# Patient Record
Sex: Female | Born: 1955 | Race: Black or African American | Hispanic: No | Smoking: Current every day smoker
Health system: Southern US, Community
[De-identification: ages and names within clinical notes are randomized; demographics above are authoritative.]

## PROBLEM LIST (undated history)

## (undated) DIAGNOSIS — C44300 Unspecified malignant neoplasm of skin of unspecified part of face: Secondary | ICD-10-CM

## (undated) DIAGNOSIS — M1990 Unspecified osteoarthritis, unspecified site: Secondary | ICD-10-CM

## (undated) DIAGNOSIS — G43909 Migraine, unspecified, not intractable, without status migrainosus: Secondary | ICD-10-CM

## (undated) DIAGNOSIS — C189 Malignant neoplasm of colon, unspecified: Secondary | ICD-10-CM

## (undated) DIAGNOSIS — R7303 Prediabetes: Secondary | ICD-10-CM

## (undated) DIAGNOSIS — J449 Chronic obstructive pulmonary disease, unspecified: Secondary | ICD-10-CM

## (undated) DIAGNOSIS — C801 Malignant (primary) neoplasm, unspecified: Secondary | ICD-10-CM

## (undated) DIAGNOSIS — R0602 Shortness of breath: Secondary | ICD-10-CM

## (undated) HISTORY — PX: SKIN CANCER EXCISION: SHX779

## (undated) HISTORY — DX: Malignant neoplasm of colon, unspecified: C18.9

## (undated) HISTORY — PX: COLON SURGERY: SHX602

---

## 2007-08-24 ENCOUNTER — Ambulatory Visit (HOSPITAL_COMMUNITY): Admission: RE | Admit: 2007-08-24 | Discharge: 2007-08-24 | Payer: Self-pay | Admitting: Family Medicine

## 2008-03-12 ENCOUNTER — Ambulatory Visit (HOSPITAL_COMMUNITY): Admission: RE | Admit: 2008-03-12 | Discharge: 2008-03-12 | Payer: Self-pay | Admitting: Family Medicine

## 2008-10-12 ENCOUNTER — Ambulatory Visit (HOSPITAL_COMMUNITY): Admission: RE | Admit: 2008-10-12 | Discharge: 2008-10-12 | Payer: Self-pay | Admitting: Family Medicine

## 2009-01-30 ENCOUNTER — Ambulatory Visit (HOSPITAL_COMMUNITY): Admission: RE | Admit: 2009-01-30 | Discharge: 2009-01-30 | Payer: Self-pay | Admitting: Family Medicine

## 2009-12-20 ENCOUNTER — Ambulatory Visit (HOSPITAL_COMMUNITY): Admission: RE | Admit: 2009-12-20 | Discharge: 2009-12-20 | Payer: Self-pay | Admitting: Neurology

## 2010-04-05 ENCOUNTER — Encounter: Payer: Self-pay | Admitting: Neurology

## 2010-10-01 ENCOUNTER — Encounter (INDEPENDENT_AMBULATORY_CARE_PROVIDER_SITE_OTHER): Payer: Self-pay | Admitting: Internal Medicine

## 2010-10-28 MED ORDER — SODIUM CHLORIDE 0.45 % IV SOLN: Freq: Once | INTRAVENOUS | Status: DC

## 2010-10-29 ENCOUNTER — Ambulatory Visit (HOSPITAL_COMMUNITY)
Admission: RE | Admit: 2010-10-29 | Discharge: 2010-10-29 | Disposition: A | Discharge status: Home / Self Care | Payer: Medicaid Other | Source: Ambulatory Visit | Attending: Internal Medicine | Admitting: Internal Medicine

## 2010-10-29 ENCOUNTER — Encounter (HOSPITAL_COMMUNITY): Payer: Self-pay | Admitting: *Deleted

## 2010-10-29 ENCOUNTER — Other Ambulatory Visit (INDEPENDENT_AMBULATORY_CARE_PROVIDER_SITE_OTHER): Payer: Self-pay | Admitting: Internal Medicine

## 2010-10-29 ENCOUNTER — Encounter (INDEPENDENT_AMBULATORY_CARE_PROVIDER_SITE_OTHER): Payer: Self-pay | Admitting: Internal Medicine

## 2010-10-29 ENCOUNTER — Encounter (HOSPITAL_COMMUNITY)
Admission: RE | Disposition: A | Discharge status: Home / Self Care | Payer: Self-pay | Source: Ambulatory Visit | Attending: Internal Medicine

## 2010-10-29 DIAGNOSIS — D126 Benign neoplasm of colon, unspecified: Secondary | ICD-10-CM

## 2010-10-29 DIAGNOSIS — Z800 Family history of malignant neoplasm of digestive organs: Secondary | ICD-10-CM

## 2010-10-29 DIAGNOSIS — Z1211 Encounter for screening for malignant neoplasm of colon: Secondary | ICD-10-CM

## 2010-10-29 HISTORY — DX: Migraine, unspecified, not intractable, without status migrainosus: G43.909

## 2010-10-29 HISTORY — DX: Unspecified malignant neoplasm of skin of unspecified part of face: C44.300

## 2010-10-29 HISTORY — DX: Prediabetes: R73.03

## 2010-10-29 HISTORY — PX: COLONOSCOPY: SHX5424

## 2010-10-29 SURGERY — COLONOSCOPY
Anesthesia: Moderate Sedation

## 2010-10-29 MED ORDER — MIDAZOLAM HCL 5 MG/5ML IJ SOLN
INTRAMUSCULAR | Status: AC
  Filled 2010-10-29: qty 10

## 2010-10-29 MED ORDER — MEPERIDINE HCL 50 MG/ML IJ SOLN
INTRAMUSCULAR | Status: AC
  Filled 2010-10-29: qty 1

## 2010-10-29 MED ORDER — MEPERIDINE HCL 50 MG/ML IJ SOLN
INTRAMUSCULAR | Status: DC | PRN
  Administered 2010-10-29 (×2): 25 mg via INTRAVENOUS

## 2010-10-29 MED ORDER — MIDAZOLAM HCL 5 MG/5ML IJ SOLN
INTRAMUSCULAR | Status: DC | PRN
  Administered 2010-10-29: 1 mg via INTRAVENOUS
  Administered 2010-10-29: 2 mg via INTRAVENOUS
  Administered 2010-10-29: 1 mg via INTRAVENOUS

## 2010-10-29 NOTE — H&P (Signed)
April Wade is an 55 y.o. female.   Chief Complaint: For screening colonoscopy. HPI: Patient is 55 year old Caucasian female who is here for average risk screening colonoscopy. This is her first exam. She denies abdominal pain or rectal bleeding. He has occasional constipation for which she takes OTC laxatives. she denies anorexia or weight loss. History is negative for colorectal carcinoma.  Past Medical History  Diagnosis Date  . Headache, migraine   . Borderline diabetic   . Skin cancer of face     Past Surgical History  Procedure Date  . Skin cancer excision     [facility]    Family History  Problem Relation Age of Onset  . Anesthesia problems Neg Hx    Social History:  reports that she has been smoking.  She does not have any smokeless tobacco history on file. She reports that she does not drink alcohol or use illicit drugs.  Allergies: Not on File  Medications Prior to Admission  Medication Dose Route Frequency Provider Last Rate Last Dose  . 0.45 % sodium chloride infusion   Intravenous Once Malissa Hippo, MD      . meperidine (DEMEROL) 50 MG/ML injection           . midazolam (VERSED) 5 MG/5ML injection            Medications Prior to Admission  Medication Sig Dispense Refill  . albuterol (PROVENTIL) (2.5 MG/3ML) 0.083% nebulizer solution Take 2.5 mg by nebulization every 6 (six) hours as needed.        Marland Kitchen ibuprofen (ADVIL,MOTRIN) 800 MG tablet Take 800 mg by mouth every 8 (eight) hours as needed.        . SUMAtriptan (IMITREX) 100 MG tablet Take 100 mg by mouth once. PRN       . topiramate (TOPAMAX) 25 MG tablet Take 25 mg by mouth 3 (three) times daily.          No results found for this or any previous visit (from the past 48 hour(s)). No results found.  Review of Systems  Constitutional: Negative for fever and weight loss.  Gastrointestinal: Positive for constipation (occasional). Negative for heartburn, nausea, vomiting, abdominal pain, diarrhea, blood in  stool and melena.    Blood pressure 131/58, pulse 54, temperature 98 F (36.7 C), temperature source Oral, resp. rate 20, height 5\' 7"  (1.702 m), weight 121 lb (54.885 kg), SpO2 98.00%. Physical Exam  Constitutional: She appears well-developed.       thin  HENT:  Mouth/Throat: Oropharynx is clear and moist.  Eyes: Conjunctivae are normal. No scleral icterus.  Neck: No thyromegaly present.  Cardiovascular: Normal rate, regular rhythm and normal heart sounds.   No murmur heard. Respiratory: Breath sounds normal.  GI: Soft. She exhibits no distension and no mass. There is no tenderness.  Musculoskeletal: She exhibits no edema.  Lymphadenopathy:    She has no cervical adenopathy.  Neurological: She is alert.  Skin: Skin is warm and dry.     Assessment/Plan Average risk screening colonoscopy.   REHMAN,NAJEEB U 10/29/2010, 10:47 AM

## 2010-10-29 NOTE — Op Note (Signed)
COLONOSCOPY PROCEDURE REPORT  PATIENT:  April Wade  MR#:  [id number] Birthdate:  1955-11-30, 55 y.o., female Endoscopist:  Dr. Malissa Hippo, MD Referred By:  Dr. Corrie Mckusick MD Procedure Date: 10/29/2010  Procedure:   Colonoscopy  Indications: Average risk screening colonoscopy. This is patient's first exam  Informed Consent:  Procedure and risks were reviewed with the patient and informed consent was obtained. Medications:  Demerol 50 mg IV Versed 4 mg IV  Description of procedure:  After a digital rectal exam was performed, that colonoscope was advanced from the anus through the rectum and colon to the area of the cecum, ileocecal valve and appendiceal orifice. The cecum was deeply intubated. These structures were well-seen and photographed for the record. From the level of the cecum and ileocecal valve, the scope was slowly and cautiously withdrawn. The mucosal surfaces were carefully surveyed utilizing scope tip to flexion to facilitate fold flattening as needed. The scope was pulled down into the rectum where a thorough exam including retroflexion was performed.  Findings:   Preparation satisfactory sept there was pasty stool at a sending colon. 2 mm  polyp at cecum ablated via cold biopsy.  25 mm flat polyp at  Ascending colon central depression. Biopsy taken.  6 x 12 mm sessile polyp at distal transverse colon saline-assisted polypectomy carried out. 8 mm polyp at proximal sigmoid colon was snared. Small polyps distal to it was coagulated with snare tip. 15 mm flex polyps at distal sigmoid colon elevated with saline injection and polypectomy performed. Polypectomy complete.  Therapeutic/Diagnostic Maneuvers Performed:  See above  Complications:  None  Cecal Withdrawal Time:  29 minutes  Impression:  3 polyps were snared. One from transverse colon and 2 from sigmoid colon. smaa cecal polyp ablated via cold biopsy. 25 mm flat polyp at  ascending colon with central  depression. it was biopsied. This will need surgical removal.  Recommendations:  No aspirin for 10 days. Physician will  contact you with biopsy results  April Wade U  10/29/2010 11:53 AM  CC: Dr.JOHN C GOLDING. MD

## 2010-11-04 ENCOUNTER — Encounter (HOSPITAL_COMMUNITY): Payer: Self-pay | Admitting: Internal Medicine

## 2010-11-18 ENCOUNTER — Encounter (HOSPITAL_COMMUNITY)
Admission: RE | Admit: 2010-11-18 | Discharge: 2010-11-18 | Disposition: A | Discharge status: Home / Self Care | Payer: Medicaid Other | Source: Ambulatory Visit | Attending: General Surgery | Admitting: General Surgery

## 2010-11-18 ENCOUNTER — Encounter (HOSPITAL_COMMUNITY): Payer: Self-pay

## 2010-11-18 ENCOUNTER — Other Ambulatory Visit: Payer: Self-pay

## 2010-11-18 HISTORY — DX: Shortness of breath: R06.02

## 2010-11-18 HISTORY — DX: Chronic obstructive pulmonary disease, unspecified: J44.9

## 2010-11-18 LAB — BASIC METABOLIC PANEL
CO2: 33 mEq/L — ABNORMAL HIGH (ref 19–32)
Calcium: 9.1 mg/dL (ref 8.4–10.5)
Chloride: 103 mEq/L (ref 96–112)
Creatinine, Ser: 0.68 mg/dL (ref 0.50–1.10)
Glucose, Bld: 63 mg/dL — ABNORMAL LOW (ref 70–99)

## 2010-11-18 LAB — SURGICAL PCR SCREEN
MRSA, PCR: NEGATIVE
Staphylococcus aureus: NEGATIVE

## 2010-11-18 LAB — CBC
HCT: 34.9 % — ABNORMAL LOW (ref 36.0–46.0)
Hemoglobin: 11.6 g/dL — ABNORMAL LOW (ref 12.0–15.0)
MCHC: 33.2 g/dL (ref 30.0–36.0)

## 2010-11-18 LAB — DIFFERENTIAL
Basophils Absolute: 0 10*3/uL (ref 0.0–0.1)
Eosinophils Relative: 2 % (ref 0–5)
Lymphocytes Relative: 44 % (ref 12–46)
Lymphs Abs: 3.1 10*3/uL (ref 0.7–4.0)
Monocytes Absolute: 0.4 10*3/uL (ref 0.1–1.0)
Neutro Abs: 3.3 10*3/uL (ref 1.7–7.7)

## 2010-11-18 MED ORDER — LACTATED RINGERS IV SOLN: INTRAVENOUS | Status: DC

## 2010-11-18 MED ORDER — METRONIDAZOLE 500 MG PO TABS: 500.0000 mg | ORAL_TABLET | Freq: Once | ORAL | Status: DC

## 2010-11-18 NOTE — Consult Note (Signed)
NAMEAYALA, RIBBLE NO.:  [id number]  MEDICAL RECORD NO.:  [id number]  LOCATION:                                 FACILITY:  PHYSICIAN:  Barbaraann Barthel, M.D. DATE OF BIRTH:  1955-05-27  DATE OF CONSULTATION:  11/17/2010 DATE OF DISCHARGE:                                CONSULTATION   Note, this is a 55 year old white female who I saw in my office and dictated an H and P on November 05, 2010.  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  HISTORY OF PRESENT MEDICAL ILLNESS:  This lady was seen by Dr. Karilyn Cota, who underwent a screening colonoscopy and was found to have multiple polyps, the most suspicious one being at the hepatic flexure which was very likely an infiltrating carcinoma.  There was certainly some very high level of dysplasia present.  I discussed this with Dr. Karilyn Cota as well as Dr. Colonel Bald in the Pathology department and we will plan for a resection likely a right colectomy to encompass this area.  I discussed this in detail with the patient and informed consent was obtained.  We plan for her to be admitted via the outpatient department after a mechanical bowel prep as an outpatient and parenteral antibiotics on day of admission and plan for surgery on the same day as admission.  We discussed complications not limited to but including bleeding, infection, and anastomotic leak and the possibility of more surgery being required.  Informed consent was obtained.  PAST MEDICAL HISTORY:  The patient has a history of diabetes, which has been controlled by diet.  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  She has had no previous surgery.  PHYSICAL EXAM:  GENERAL:  She is a pleasant, anxious-appearing 55 year old thin female, who is 5 feet 7, weighs 121 pounds. VITAL SIGNS:  Her temperature is 98.4, pulse was 60 and regular, respirations are 12 per minute,  and blood pressure is 120/54. HEENT:  Head is normocephalic.  Eyes:  Extraocular movements are intact. Pupils were round and reactive to light and accommodation.  There is no conjunctive pallor or scleral injection.  The sclera is a normal tincture.  Her nose and oral mucosa were moist.  The patient has dental prosthesis. NECK:  Supple and cylindrical without jugular vein distention, thyromegaly, tracheal deviation, or cervical adenopathy.  No bruits are appreciated. CHEST:  Clear both anterior and posterior auscultation. HEART:  Regular rhythm. BREASTS AND AXILLAE:  Without masses. ABDOMEN:  Soft. PELVIC:  Deferred. EXTREMITIES:  Within normal limits. SKIN:  Within normal limits. RECTAL:  Not repeated as the patient has just undergone colonoscopy.  REVIEW OF SYSTEMS:  NEURO SYSTEM:  Grossly no focal findings.  She has a history of migraines.  No history of seizures.  ENDOCRINE SYSTEM: Diabetes, controlled by diet.  CARDIORESPIRATORY SYSTEM:  See history of COPD and tobacco use.  She smokes half a pack of cigarettes a day.  She is not an alcohol abuser.  MUSCULOSKELETAL SYSTEM:  Grossly within normal limits for chronic arthritic complaints.  OB/GYN HISTORY:  She  is a gravida 3, para 2, abortus one, cesarean zero female with no family history of breast carcinoma.  Her last menstrual period was approximately 8 years ago.  I do not know when her last mammogram was recorded.  GI SYSTEM:  No history of hepatitis, constipation, diarrhea, bright red rectal bleeding, black tarry stools, history of inflammatory bowel disease, or unexplained weight loss.  Results of colonoscopy done in 2012 or as mentioned above with the plans for colon resection.  GU SYSTEM:  No history of dysuria or kidney stones.  IMPRESSION:  Therefore, Ms. 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   As stated, we discussed complications and informed consent was obtained.  ALLERGIES:  The patient has no known allergies.  MEDICATIONS:  Include, vitamin D3 2000 international units a day, Restasis 0.05% one drop two times a day, Proventil spray q.4 h. p.r.n. as needed, ibuprofen 800 mg three times a day as needed, topiramate 25 mg at bedtime, sumatriptan 100 mg one as needed and as stated she has no known allergies.  PLAN:  Therefore, right colectomy via the outpatient department and with appropriate mechanical and antibiotic bowel preparation.     Barbaraann Barthel, M.D.     WB/MEDQ  D:  11/17/2010  T:  11/17/2010  Job:  [id number]  cc:   Lionel December, M.D. Fax: [phone]

## 2010-11-18 NOTE — Patient Instructions (Addendum)
[name]  11/18/2010   Your procedure is scheduled on:  11/21/2010  [facility] to [facility] at [id number].  Call this number if you have problems the morning of surgery: [phone]   Remember:   Do not eat food:After Midnight.  Do not drink clear liquids: After Midnight.  Take these medicines the morning of surgery with A SIP OF WATER: topamax, albutereol inhaler-nebs   Do not wear jewelry, make-up or nail polish.  Do not wear lotions, powders, or perfumes. You may wear deodorant.  Do not shave 48 hours prior to surgery.  Do not bring valuables to the hospital.  Contacts, dentures or bridgework may not be worn into surgery.  Leave suitcase in the car. After surgery it may be brought to your room.  For patients admitted to the hospital, checkout time is 11:00 AM the day of discharge.   Patients discharged the day of surgery will not be allowed to drive home.  Name and phone number of your driver: n/a  Special Instructions: CHG Shower Use Special Wash: 1/2 bottle night before surgery and 1/2 bottle morning of surgery.   Please read over the following fact sheets that you were given: Pain Booklet, MRSA Information, Surgical Site Infection Prevention, Anesthesia Post-op Instructions and Care and Recovery After Surgery PATIENT INSTRUCTIONS POST-ANESTHESIA  IMMEDIATELY FOLLOWING SURGERY:  Do not drive or operate machinery for the first twenty four hours after surgery.  Do not make any important decisions for twenty four hours after surgery or while taking narcotic pain medications or sedatives.  If you develop intractable nausea and vomiting or a severe headache please notify your doctor immediately.  FOLLOW-UP:  Please make an appointment with your surgeon as instructed. You do not need to follow up with anesthesia unless specifically instructed to do so.  WOUND CARE INSTRUCTIONS (if applicable):  Keep a dry clean dressing on the anesthesia/puncture wound site if there is drainage.  Once the  wound has quit draining you may leave it open to air.  Generally you should leave the bandage intact for twenty four hours unless there is drainage.  If the epidural site drains for more than 36-48 hours please call the anesthesia department.  QUESTIONS?:  Please feel free to call your physician or the hospital operator if you have any questions, and they will be happy to assist you.     [facility] Anesthesia Department [address] [place] [phone]

## 2010-11-18 NOTE — Pre-Procedure Instructions (Signed)
K+ 3.1-faxed to Dr Daisy Blossom office.Called and spoke with Harriett Sine to have him look at these labs.

## 2010-11-19 NOTE — Pre-Procedure Instructions (Signed)
Dr Jayme Cloud shown K+ of 3.1.Will do I-stat on arrival. Left message at Dr Daisy Blossom office to make sure they will put pt on potassium prior to surgery.

## 2010-11-21 ENCOUNTER — Inpatient Hospital Stay (HOSPITAL_COMMUNITY): Payer: Medicaid Other | Admitting: Anesthesiology

## 2010-11-21 ENCOUNTER — Encounter (HOSPITAL_COMMUNITY): Payer: Self-pay | Admitting: Anesthesiology

## 2010-11-21 ENCOUNTER — Encounter (HOSPITAL_COMMUNITY): Payer: Self-pay | Admitting: *Deleted

## 2010-11-21 ENCOUNTER — Inpatient Hospital Stay (HOSPITAL_COMMUNITY)
Admission: RE | Admit: 2010-11-21 | Discharge: 2010-11-26 | DRG: 331 | Disposition: A | Discharge status: Home / Self Care | Payer: Medicaid Other | Source: Ambulatory Visit | Attending: General Surgery | Admitting: General Surgery

## 2010-11-21 ENCOUNTER — Encounter (HOSPITAL_COMMUNITY)
Admission: RE | Disposition: A | Discharge status: Home / Self Care | Payer: Self-pay | Source: Ambulatory Visit | Attending: General Surgery

## 2010-11-21 ENCOUNTER — Other Ambulatory Visit: Payer: Self-pay | Admitting: General Surgery

## 2010-11-21 DIAGNOSIS — Z23 Encounter for immunization: Secondary | ICD-10-CM

## 2010-11-21 DIAGNOSIS — J449 Chronic obstructive pulmonary disease, unspecified: Secondary | ICD-10-CM | POA: Diagnosis present

## 2010-11-21 DIAGNOSIS — C189 Malignant neoplasm of colon, unspecified: Secondary | ICD-10-CM

## 2010-11-21 DIAGNOSIS — J4489 Other specified chronic obstructive pulmonary disease: Secondary | ICD-10-CM | POA: Diagnosis present

## 2010-11-21 DIAGNOSIS — Z01812 Encounter for preprocedural laboratory examination: Secondary | ICD-10-CM

## 2010-11-21 DIAGNOSIS — C183 Malignant neoplasm of hepatic flexure: Principal | ICD-10-CM | POA: Diagnosis present

## 2010-11-21 DIAGNOSIS — Z01810 Encounter for preprocedural cardiovascular examination: Secondary | ICD-10-CM

## 2010-11-21 DIAGNOSIS — E876 Hypokalemia: Secondary | ICD-10-CM | POA: Diagnosis not present

## 2010-11-21 HISTORY — PX: COLOSTOMY REVISION: SHX5232

## 2010-11-21 LAB — CBC
Platelets: 206 10*3/uL (ref 150–400)
RBC: 3.51 MIL/uL — ABNORMAL LOW (ref 3.87–5.11)
RDW: 15.4 % (ref 11.5–15.5)
WBC: 16.7 10*3/uL — ABNORMAL HIGH (ref 4.0–10.5)

## 2010-11-21 LAB — BASIC METABOLIC PANEL
CO2: 28 mEq/L (ref 19–32)
Calcium: 9.1 mg/dL (ref 8.4–10.5)
Calcium: 8.4 mg/dL (ref 8.4–10.5)
Chloride: 104 mEq/L (ref 96–112)
Creatinine, Ser: 0.63 mg/dL (ref 0.50–1.10)
GFR calc Af Amer: >60 mL/min (ref >60)
Glucose, Bld: 117 mg/dL — ABNORMAL HIGH (ref 70–99)
Potassium: 3.4 mEq/L — ABNORMAL LOW (ref 3.5–5.1)
Sodium: 145 mEq/L (ref 135–145)
Sodium: 142 mEq/L (ref 135–145)

## 2010-11-21 LAB — DIFFERENTIAL
Basophils Absolute: 0 10*3/uL (ref 0.0–0.1)
Eosinophils Relative: 0 % (ref 0–5)
Lymphocytes Relative: 11 % — ABNORMAL LOW (ref 12–46)
Lymphs Abs: 1.9 10*3/uL (ref 0.7–4.0)
Neutro Abs: 14.1 10*3/uL — ABNORMAL HIGH (ref 1.7–7.7)
Neutrophils Relative %: 84 % — ABNORMAL HIGH (ref 43–77)

## 2010-11-21 SURGERY — COLECTOMY, RIGHT
Anesthesia: General | Laterality: Right

## 2010-11-21 MED ORDER — ACETAMINOPHEN 325 MG PO TABS
650.0000 mg | ORAL_TABLET | Freq: Four times a day (QID) | ORAL | Status: DC | PRN
  Administered 2010-11-25: 650 mg via ORAL
  Filled 2010-11-21: qty 2

## 2010-11-21 MED ORDER — SUCCINYLCHOLINE CHLORIDE 20 MG/ML IJ SOLN
INTRAMUSCULAR | Status: AC
  Filled 2010-11-21: qty 1

## 2010-11-21 MED ORDER — ACETAMINOPHEN 650 MG RE SUPP: 650.0000 mg | Freq: Four times a day (QID) | RECTAL | Status: DC | PRN

## 2010-11-21 MED ORDER — GLYCOPYRROLATE 0.2 MG/ML IJ SOLN
INTRAMUSCULAR | Status: DC | PRN
  Administered 2010-11-21: .6 mg via INTRAVENOUS

## 2010-11-21 MED ORDER — CIPROFLOXACIN IN D5W 400 MG/200ML IV SOLN
INTRAVENOUS | Status: AC
  Filled 2010-11-21: qty 200

## 2010-11-21 MED ORDER — PROPOFOL 10 MG/ML IV EMUL
INTRAVENOUS | Status: AC
  Filled 2010-11-21: qty 20

## 2010-11-21 MED ORDER — GLYCOPYRROLATE 0.2 MG/ML IJ SOLN
0.2000 mg | Freq: Once | INTRAMUSCULAR | Status: AC | PRN
  Administered 2010-11-21: 0.2 mg via INTRAVENOUS

## 2010-11-21 MED ORDER — ACETAMINOPHEN 325 MG PO TABS: 325.0000 mg | ORAL_TABLET | ORAL | Status: DC | PRN

## 2010-11-21 MED ORDER — POVIDONE-IODINE 10 % EX OINT
TOPICAL_OINTMENT | CUTANEOUS | Status: AC
  Filled 2010-11-21: qty 2

## 2010-11-21 MED ORDER — FENTANYL CITRATE 0.05 MG/ML IJ SOLN
INTRAMUSCULAR | Status: AC
  Filled 2010-11-21: qty 5

## 2010-11-21 MED ORDER — METRONIDAZOLE IN NACL 5-0.79 MG/ML-% IV SOLN
500.0000 mg | Freq: Three times a day (TID) | INTRAVENOUS | Status: AC
  Administered 2010-11-21 – 2010-11-22 (×2): 500 mg via INTRAVENOUS
  Filled 2010-11-21 (×4): qty 100

## 2010-11-21 MED ORDER — ROCURONIUM BROMIDE 100 MG/10ML IV SOLN
INTRAVENOUS | Status: DC | PRN
  Administered 2010-11-21: 40 mg via INTRAVENOUS
  Administered 2010-11-21: 10 mg via INTRAVENOUS

## 2010-11-21 MED ORDER — LIDOCAINE HCL 1 % IJ SOLN
INTRAMUSCULAR | Status: DC | PRN
  Administered 2010-11-21: 40 mg via INTRADERMAL

## 2010-11-21 MED ORDER — NEOSTIGMINE METHYLSULFATE 1 MG/ML IJ SOLN
INTRAMUSCULAR | Status: DC | PRN
  Administered 2010-11-21: 3 mg via INTRAMUSCULAR

## 2010-11-21 MED ORDER — LORAZEPAM 0.5 MG PO TABS: 0.5000 mg | ORAL_TABLET | Freq: Every evening | ORAL | Status: DC | PRN

## 2010-11-21 MED ORDER — LACTATED RINGERS IV SOLN
INTRAVENOUS | Status: DC | PRN
  Administered 2010-11-21 (×2): via INTRAVENOUS

## 2010-11-21 MED ORDER — SODIUM CHLORIDE 0.9 % IV SOLN
INTRAVENOUS | Status: DC
  Administered 2010-11-21 – 2010-11-23 (×3): via INTRAVENOUS

## 2010-11-21 MED ORDER — MIDAZOLAM HCL 2 MG/2ML IJ SOLN
1.0000 mg | INTRAMUSCULAR | Status: DC | PRN
  Administered 2010-11-21: 2 mg via INTRAVENOUS

## 2010-11-21 MED ORDER — TOPIRAMATE 25 MG PO TABS
25.0000 mg | ORAL_TABLET | Freq: Three times a day (TID) | ORAL | Status: DC
  Administered 2010-11-21 – 2010-11-26 (×14): 25 mg via ORAL
  Filled 2010-11-21 (×21): qty 1

## 2010-11-21 MED ORDER — MORPHINE SULFATE 2 MG/ML IJ SOLN
1.0000 mg | INTRAMUSCULAR | Status: DC | PRN
  Administered 2010-11-22 – 2010-11-25 (×9): 1 mg via INTRAVENOUS
  Filled 2010-11-21 (×9): qty 1

## 2010-11-21 MED ORDER — NEOSTIGMINE METHYLSULFATE 1 MG/ML IJ SOLN
INTRAMUSCULAR | Status: AC
  Filled 2010-11-21: qty 10

## 2010-11-21 MED ORDER — ONDANSETRON HCL 4 MG/2ML IJ SOLN: 4.0000 mg | Freq: Once | INTRAMUSCULAR | Status: DC | PRN

## 2010-11-21 MED ORDER — ROCURONIUM BROMIDE 50 MG/5ML IV SOLN
INTRAVENOUS | Status: AC
  Filled 2010-11-21: qty 1

## 2010-11-21 MED ORDER — GLYCOPYRROLATE 0.2 MG/ML IJ SOLN
INTRAMUSCULAR | Status: AC
  Filled 2010-11-21: qty 2

## 2010-11-21 MED ORDER — ONDANSETRON HCL 4 MG/2ML IJ SOLN
4.0000 mg | Freq: Once | INTRAMUSCULAR | Status: AC
  Administered 2010-11-21: 4 mg via INTRAVENOUS

## 2010-11-21 MED ORDER — FENTANYL CITRATE 0.05 MG/ML IJ SOLN
25.0000 ug | INTRAMUSCULAR | Status: DC | PRN
  Administered 2010-11-21: 50 ug via INTRAVENOUS

## 2010-11-21 MED ORDER — STERILE WATER FOR IRRIGATION IR SOLN
Status: DC | PRN
  Administered 2010-11-21 (×2): 2000 mL

## 2010-11-21 MED ORDER — FENTANYL CITRATE 0.05 MG/ML IJ SOLN
INTRAMUSCULAR | Status: AC
  Administered 2010-11-21: 50 ug via INTRAVENOUS
  Filled 2010-11-21: qty 2

## 2010-11-21 MED ORDER — METRONIDAZOLE IN NACL 5-0.79 MG/ML-% IV SOLN
INTRAVENOUS | Status: AC
  Filled 2010-11-21: qty 100

## 2010-11-21 MED ORDER — CIPROFLOXACIN IN D5W 400 MG/200ML IV SOLN
400.0000 mg | Freq: Two times a day (BID) | INTRAVENOUS | Status: AC
  Administered 2010-11-21 – 2010-11-22 (×2): 400 mg via INTRAVENOUS
  Filled 2010-11-21 (×3): qty 200

## 2010-11-21 MED ORDER — CIPROFLOXACIN IN D5W 400 MG/200ML IV SOLN: 500.0000 mg | INTRAVENOUS | Status: DC

## 2010-11-21 MED ORDER — FENTANYL CITRATE 0.05 MG/ML IJ SOLN
INTRAMUSCULAR | Status: DC | PRN
  Administered 2010-11-21 (×7): 50 ug via INTRAVENOUS

## 2010-11-21 MED ORDER — GLYCOPYRROLATE 0.2 MG/ML IJ SOLN
INTRAMUSCULAR | Status: AC
  Filled 2010-11-21: qty 1

## 2010-11-21 MED ORDER — METRONIDAZOLE IN NACL 5-0.79 MG/ML-% IV SOLN
500.0000 mg | INTRAVENOUS | Status: AC
  Administered 2010-11-21: 500 mg via INTRAVENOUS

## 2010-11-21 MED ORDER — SODIUM CHLORIDE 0.9 % IR SOLN
Status: DC | PRN
  Administered 2010-11-21 (×2): 2000 mL

## 2010-11-21 MED ORDER — ONDANSETRON HCL 4 MG/2ML IJ SOLN
INTRAMUSCULAR | Status: AC
  Filled 2010-11-21: qty 2

## 2010-11-21 MED ORDER — CIPROFLOXACIN IN D5W 400 MG/200ML IV SOLN
INTRAVENOUS | Status: DC | PRN
  Administered 2010-11-21: 400 mg via INTRAVENOUS

## 2010-11-21 MED ORDER — ONDANSETRON HCL 4 MG/2ML IJ SOLN
4.0000 mg | Freq: Four times a day (QID) | INTRAMUSCULAR | Status: DC | PRN
  Administered 2010-11-22: 4 mg via INTRAVENOUS
  Filled 2010-11-21: qty 2

## 2010-11-21 MED ORDER — LACTATED RINGERS IV SOLN: INTRAVENOUS | Status: DC

## 2010-11-21 MED ORDER — PNEUMOCOCCAL VAC POLYVALENT 25 MCG/0.5ML IJ INJ
0.5000 mL | INJECTION | INTRAMUSCULAR | Status: AC
  Administered 2010-11-22: 0.5 mL via INTRAMUSCULAR
  Filled 2010-11-21: qty 0.5

## 2010-11-21 MED ORDER — MIDAZOLAM HCL 2 MG/2ML IJ SOLN
INTRAMUSCULAR | Status: AC
  Filled 2010-11-21: qty 2

## 2010-11-21 MED ORDER — PHENOL 1.4 % MT LIQD
1.0000 | OROMUCOSAL | Status: DC | PRN
  Filled 2010-11-21: qty 177

## 2010-11-21 MED ORDER — PROPOFOL 10 MG/ML IV EMUL
INTRAVENOUS | Status: DC | PRN
  Administered 2010-11-21: 130 mg via INTRAVENOUS

## 2010-11-21 MED ORDER — LIDOCAINE HCL (PF) 1 % IJ SOLN
INTRAMUSCULAR | Status: AC
  Filled 2010-11-21: qty 5

## 2010-11-21 MED ORDER — LACTATED RINGERS IV SOLN
INTRAVENOUS | Status: DC
  Administered 2010-11-21: 08:00:00 via INTRAVENOUS

## 2010-11-21 MED ORDER — ALBUTEROL SULFATE HFA 108 (90 BASE) MCG/ACT IN AERS: 2.0000 | INHALATION_SPRAY | Freq: Four times a day (QID) | RESPIRATORY_TRACT | Status: DC | PRN

## 2010-11-21 SURGICAL SUPPLY — 70 items
APPLIER CLIP 11 MED OPEN (CLIP)
APPLIER CLIP 13 LRG OPEN (CLIP)
ATTRACTOMAT 16X20 MAGNETIC DRP (DRAPES) ×2 IMPLANT
BAG HAMPER (MISCELLANEOUS) ×2 IMPLANT
BARRIER SKIN 2 3/4 (OSTOMY) IMPLANT
CELLS DAT CNTRL 66122 CELL SVR (MISCELLANEOUS) IMPLANT
CLAMP POUCH DRAINAGE QUIET (OSTOMY) IMPLANT
CLIP APPLIE 11 MED OPEN (CLIP) IMPLANT
CLIP APPLIE 13 LRG OPEN (CLIP) IMPLANT
CLOTH BEACON ORANGE TIMEOUT ST (SAFETY) ×2 IMPLANT
COVER LIGHT HANDLE STERIS (MISCELLANEOUS) ×4 IMPLANT
COVER MAYO STAND XLG (DRAPE) IMPLANT
DRAPE PROXIMA HALF (DRAPES) ×2 IMPLANT
DRAPE WARM FLUID 44X44 (DRAPE) ×2 IMPLANT
DRSG TEGADERM 4X10 (GAUZE/BANDAGES/DRESSINGS) ×4 IMPLANT
ELECT REM PT RETURN 9FT ADLT (ELECTROSURGICAL) ×2
ELECTRODE REM PT RTRN 9FT ADLT (ELECTROSURGICAL) ×1 IMPLANT
GLOVE BIOGEL PI IND STRL 8 (GLOVE) ×1 IMPLANT
GLOVE BIOGEL PI INDICATOR 8 (GLOVE) ×1
GLOVE ECLIPSE 6.5 STRL STRAW (GLOVE) ×6 IMPLANT
GLOVE INDICATOR 7.0 STRL GRN (GLOVE) ×2 IMPLANT
GLOVE OPTIFIT SS 8.0 STRL (GLOVE) ×4 IMPLANT
GLOVE SKINSENSE NS SZ7.0 (GLOVE) ×2
GLOVE SKINSENSE STRL SZ7.0 (GLOVE) ×2 IMPLANT
GOWN BRE IMP SLV AUR XL STRL (GOWN DISPOSABLE) ×6 IMPLANT
INST SET MAJOR GENERAL (KITS) ×2 IMPLANT
KIT ROOM TURNOVER APOR (KITS) IMPLANT
LIGASURE IMPACT 36 18CM CVD LR (INSTRUMENTS) ×2 IMPLANT
MANIFOLD NEPTUNE II (INSTRUMENTS) ×2 IMPLANT
NS IRRIG 1000ML POUR BTL (IV SOLUTION) ×4 IMPLANT
PACK ABDOMINAL MAJOR (CUSTOM PROCEDURE TRAY) ×2 IMPLANT
PAD ARMBOARD 7.5X6 YLW CONV (MISCELLANEOUS) ×2 IMPLANT
POUCH OSTOMY 2 3/4  H 3804 (WOUND CARE)
POUCH OSTOMY 2 PC DRNBL 2.75 (WOUND CARE) IMPLANT
RELOAD LINEAR CUT PROX 55 BLUE (ENDOMECHANICALS) IMPLANT
RELOAD PROXIMATE 75MM BLUE (ENDOMECHANICALS) ×4 IMPLANT
RETAINER VISCERA MED (MISCELLANEOUS) ×2 IMPLANT
RETRACTOR WND ALEXIS 25 LRG (MISCELLANEOUS) ×1 IMPLANT
RETRACTOR WOUND ALXS 34CM XLRG (MISCELLANEOUS) IMPLANT
RTRCTR WOUND ALEXIS 18CM MED (MISCELLANEOUS)
RTRCTR WOUND ALEXIS 25CM LRG (MISCELLANEOUS) ×2
RTRCTR WOUND ALEXIS 34CM XLRG (MISCELLANEOUS)
SET BASIN LINEN APH (SET/KITS/TRAYS/PACK) ×2 IMPLANT
SOL PREP PROV IODINE SCRUB 4OZ (MISCELLANEOUS) ×2 IMPLANT
SPONGE GAUZE 4X4 12PLY (GAUZE/BANDAGES/DRESSINGS) ×2 IMPLANT
SPONGE LAP 18X18 X RAY DECT (DISPOSABLE) ×2 IMPLANT
STAPLER AUT SUT LDS 15W (STAPLE) IMPLANT
STAPLER GUN LINEAR PROX 60 (STAPLE) ×2 IMPLANT
STAPLER PROXIMATE 55 BLUE (STAPLE) ×2 IMPLANT
STAPLER PROXIMATE 75MM BLUE (STAPLE) ×2 IMPLANT
STAPLER VISISTAT 35W (STAPLE) ×2 IMPLANT
SUCTION POOLE TIP (SUCTIONS) ×2 IMPLANT
SUT CHROMIC 4 0 PS 2 18 (SUTURE) IMPLANT
SUT NOVA NAB GS-26 0 60 (SUTURE) IMPLANT
SUT PDS AB 3-0 SH 27 (SUTURE) IMPLANT
SUT PROLENE 0 CT 1 CR/8 (SUTURE) ×4 IMPLANT
SUT PROLENE 1 CT 1 30 (SUTURE) IMPLANT
SUT SILK 2 0 (SUTURE) ×1
SUT SILK 2-0 18XBRD TIE 12 (SUTURE) ×1 IMPLANT
SUT SILK 3 0 SH CR/8 (SUTURE) ×4 IMPLANT
SUT VIC AB 0 CT1 27 (SUTURE) ×1
SUT VIC AB 0 CT1 27XBRD ANTBC (SUTURE) ×1 IMPLANT
SUT VIC AB 0 CT1 27XCR 8 STRN (SUTURE) IMPLANT
SUT VIC AB 3-0 SH 27 (SUTURE) ×1
SUT VIC AB 3-0 SH 27X BRD (SUTURE) ×1 IMPLANT
SUT VICRYL AB 2 0 TIES (SUTURE) ×2 IMPLANT
SUT VICRYL AB 3 0 TIES (SUTURE) IMPLANT
SYR BULB IRRIGATION 50ML (SYRINGE) ×2 IMPLANT
TRAY FOLEY CATH 14FR (SET/KITS/TRAYS/PACK) ×2 IMPLANT
WATER STERILE IRR 1000ML POUR (IV SOLUTION) ×8 IMPLANT

## 2010-11-21 NOTE — Anesthesia Preprocedure Evaluation (Signed)
Anesthesia Evaluation  Name, MR# and DOB Patient awake  General Assessment Comment  Reviewed: Allergy & Precautions, H&P , NPO status , Patient's Chart, lab work & pertinent test results  Airway Mallampati: I TM Distance: >3 FB Neck ROM: Full    Dental  (+) Edentulous Upper and Edentulous Lower   Pulmonary (+) shortness of breath asthmaCOPD COPD inhaler  clear to auscultation  breath sounds clear to auscultation none    Cardiovascular Regular Normal    Neuro/Psych   Headaches   Negative Psych ROS  GI/Hepatic/Renal negative GI ROS  negative Liver ROS  negative Renal ROS        Endo/Other  (+) Diabetes mellitus-, Well Controlled, Type 2,     Abdominal Normal abdominal exam  (+)   Musculoskeletal negative musculoskeletal ROS (+)   Hematology  (+) Blood dyscrasia, anemia, ,   Peds  Reproductive/Obstetrics    Anesthesia Other Findings             Anesthesia Physical Anesthesia Plan  ASA: II  Anesthesia Plan: General   Post-op Pain Management:    Induction: Intravenous  Airway Management Planned: Oral ETT  Additional Equipment:   Intra-op Plan:   Post-operative Plan: Extubation in OR  Informed Consent: I have reviewed the patients History and Physical, chart, labs and discussed the procedure including the risks, benefits and alternatives for the proposed anesthesia with the patient or authorized representative who has indicated his/her understanding and acceptance.     Plan Discussed with: CRNA  Anesthesia Plan Comments:         Anesthesia Quick Evaluation

## 2010-11-21 NOTE — Transfer of Care (Signed)
Immediate Anesthesia Transfer of Care Note  Patient: April Wade  Procedure(s) Performed:  COLON RESECTION RIGHT - Right Colectomy, Frozen section.  Pathology notified 11/20/2010  Patient Location: PACU  Anesthesia Type: General  Level of Consciousness: awake and alert   Airway & Oxygen Therapy: Patient Spontanous Breathing and Patient connected to face mask  Post-op Assessment: Report given to PACU RN  Post vital signs: stable  Complications: No apparent anesthesia complications

## 2010-11-21 NOTE — Progress Notes (Signed)
Post OP visit  Awake and alert.  Min incisional pain.  Wound clean and dry.  Voiding well.  Filed Vitals:   11/21/10 1300  BP: 119/70  Pulse: 62  Temp: 97.7 F (36.5 C)  Resp: 12

## 2010-11-21 NOTE — Op Note (Signed)
NAMEJAVON, HUPFER NO.:  [id number]  MEDICAL RECORD NO.:  [id number]  LOCATION:  [facility]                          FACILITY:  [facility]  PHYSICIAN:  Barbaraann Barthel, M.D. DATE OF BIRTH:  Nov 30, 1955  DATE OF PROCEDURE:  11/21/2010 DATE OF DISCHARGE:                              OPERATIVE REPORT   SURGEON:  Barbaraann Barthel, MD.  PREOPERATIVE DIAGNOSIS:  Dysplastic lesion at the hepatic flexure.  POSTOPERATIVE DIAGNOSIS:  Adenocarcinoma.  PROCEDURE:  Right hemicolectomy.  Note, this is a 55 year old white female who underwent routine screening colonoscopy and was found to have multiple polyps.  There was a particular worrisome one in the hepatic flexure that was very dysplastic with a strong suspicion of carcinoma.  Surgery was consulted.  We discussed the need for a colon resection, discussing complications not limited to, but including bleeding, infection, anastomotic leak. Informed consent was obtained.  GROSS OPERATIVE FINDINGS:  The patient's exploration of the abdomen was otherwise negative.  She had probably a 1 cm to 2 cm firm mass that I could feel in the hepatic flexure, but there was no sign of any erosion through the serosa.  There was no obviously inflamed lymph nodes.  The liver was without any metastasis.  Examination of the rest of the abdomen, the stomach, the gallbladder was without stones.  Stomach and pancreas were normal.  She had two atrophic ovaries which were normal in appearance and as well as an atrophic uterus without any sign of any other inflammation.  The left colon and rectosigmoid was all within normal limits.  The small bowel otherwise had no other lesions.  TECHNIQUE:  The patient was placed in supine position.  After adequate administration of general anesthesia via endotracheal intubation, her entire abdomen was prepped with Betadine solution and draped in usual manner.  Prior to this, a Foley catheter was aseptically  inserted.  The incision was carried out through the midline from just below the xiphoid process to a few centimeters below the umbilicus.  The abdomen was opened in the midline and the abdomen was explored with the above findings.  We palpated the lesion and then divided the transverse colon just in order to preserve the blood supply from the left branch of the mid colic artery and we also divided the terminal ileum likewise in order to provide a good arc from the ileocolic artery for a safe anastomosis.  We then took down the right gutter and using the ligature, we divided the mesentery after opening this and the large vessels.  The larger vessels were tied with 2-0 silk and we removed the specimen.  I put a long silk suture in the palpable palpated mass to help orient the pathologist.  The pathologist indeed reported that this was in fact an adenocarcinoma with good 5-cm margins around this.  With this information, we then continued the procedure, performing a side-to-side anastomosis with a GIA stapler and closing the anastomosis with a TA-55. This was oversewn with 3-0 GI silk and this was buttressed with a piece of omentum.  We then repaired the mesentery with a running 3-0 Vicryl suture, and we changed gloves, irrigated with normal saline solution  and then after checking for hemostasis, closed the abdomen in the midline with interrupted figure-of-eight O Vicryl sutures.  I placed an NG tube and this was palpated in place prior to closure.  The patient received approximately 1700 mL of crystalloids.  The estimated blood loss was less than 50 mL.  The patient tolerated the procedure well.     Barbaraann Barthel, M.D.     WB/MEDQ  D:  11/21/2010  T:  11/21/2010  Job:  [id number]  cc:   Lionel December, M.D. Fax: [phone]  Dr. Phillips Odor

## 2010-11-21 NOTE — Progress Notes (Signed)
Encounter addended by: Glynn Octave on: 11/21/2010 11:58 AM<BR>     Documentation filed: Charges VN

## 2010-11-21 NOTE — Anesthesia Postprocedure Evaluation (Signed)
  Anesthesia Post-op Note  Patient: April Wade  Procedure(s) Performed:  COLON RESECTION RIGHT - Right Colectomy, Frozen section.  Pathology notified 11/20/2010  Patient Location: PACU  Anesthesia Type: General  Level of Consciousness: awake, alert  and oriented  Airway and Oxygen Therapy: Patient Spontanous Breathing and Patient connected to face mask  Post-op Pain: mild  Post-op Assessment: Post-op Vital signs reviewed, Patient's Cardiovascular Status Stable and Respiratory Function Stable  Post-op Vital Signs: stable  Complications: No apparent anesthesia complications

## 2010-11-21 NOTE — Brief Op Note (Signed)
11/21/2010  11:41 AM  PATIENT:  April Wade  55 y.o. female  PRE-OPERATIVE DIAGNOSIS:  Adenocarcinoma of Hepatic Flexure  POST-OPERATIVE DIAGNOSIS:  Adenocarcinoma of Hepatic Flexure  PROCEDURE:  Procedure(s): COLON RESECTION RIGHT  SURGEON:  Surgeon(s): Marlane Hatcher  PHYSICIAN ASSISTANT:   ASSISTANTS: none   ANESTHESIA:   general  OR FLUID I/O:  Total I/O In: 2000 [I.V.:2000] Out: 525 [Urine:475; Blood:50]  BLOOD ADMINISTERED:none  DRAINS: none   LOCAL MEDICATIONS USED:  NONE  SPECIMEN:  Source of Specimen:  right colon  DISPOSITION OF SPECIMEN:  PATHOLOGY  COUNTS:  YES  TOURNIQUET:  * No tourniquets in log *  DICTATION: .Other Dictation: Dictation Number [id number]  PLAN OF CARE: Admit to inpatient   PATIENT DISPOSITION:  PACU - hemodynamically stable.   Delay start of Pharmacological VTE agent (>24hrs) due to surgical blood loss or risk of bleeding:  not applicable

## 2010-11-21 NOTE — H&P (Addendum)
  No change in H&P since seen as OP.  Procedure discussed in detail and all questions answered.  [K+] level now 3.5 after oral replacement.  For surgery today.  Pt. Will have parenteral antibiotic prep now; she received mechanical prep as outpatient.

## 2010-11-22 ENCOUNTER — Other Ambulatory Visit: Payer: Self-pay | Admitting: General Surgery

## 2010-11-22 LAB — BASIC METABOLIC PANEL
BUN: 4 mg/dL — ABNORMAL LOW (ref 6–23)
Chloride: 108 mEq/L (ref 96–112)
GFR calc Af Amer: >60 mL/min (ref >60)
GFR calc non Af Amer: >60 mL/min (ref >60)
Glucose, Bld: 111 mg/dL — ABNORMAL HIGH (ref 70–99)
Potassium: 3.5 mEq/L (ref 3.5–5.1)
Sodium: 141 mEq/L (ref 135–145)

## 2010-11-22 LAB — TYPE AND SCREEN: Antibody Screen: NEGATIVE

## 2010-11-22 LAB — CBC
HCT: 33.2 % — ABNORMAL LOW (ref 36.0–46.0)
Hemoglobin: 11.1 g/dL — ABNORMAL LOW (ref 12.0–15.0)
MCH: 31.2 pg (ref 26.0–34.0)
RBC: 3.56 MIL/uL — ABNORMAL LOW (ref 3.87–5.11)

## 2010-11-22 MED ORDER — FAMOTIDINE IN NACL 20-0.9 MG/50ML-% IV SOLN
20.0000 mg | Freq: Two times a day (BID) | INTRAVENOUS | Status: DC
  Administered 2010-11-22 (×2): 20 mg via INTRAVENOUS
  Administered 2010-11-23: 23:00:00 via INTRAVENOUS
  Administered 2010-11-23 – 2010-11-25 (×5): 20 mg via INTRAVENOUS
  Filled 2010-11-22 (×9): qty 50

## 2010-11-22 NOTE — Progress Notes (Signed)
POD#1  Pt feels, well minimal incisional discomfort.  Pt voiding well, will decrease fluids, pt has a little edema in her fingers.   Wound clean and dry, will change in AM.  Abdomen  is soft a few tinkles audible. Doing well. Labs noted, will repeat in AM.  Filed Vitals:   11/22/10 0600  BP: 132/71  Pulse: 66  Temp: 98.5 F (36.9 C)  Resp: 16

## 2010-11-22 NOTE — Addendum Note (Signed)
Addendum  created 11/22/10 1136 by Marylene Buerger, CRNA   Modules edited:Anesthesia Events, Notes Section

## 2010-11-22 NOTE — Anesthesia Post-op Follow-up Note (Signed)
Doing Well. Room 320. No anesthesia related problems.

## 2010-11-23 LAB — CBC
Platelets: 188 10*3/uL (ref 150–400)
RDW: 15.1 % (ref 11.5–15.5)
WBC: 10.6 10*3/uL — ABNORMAL HIGH (ref 4.0–10.5)

## 2010-11-23 LAB — BASIC METABOLIC PANEL
Chloride: 104 mEq/L (ref 96–112)
GFR calc Af Amer: >60 mL/min (ref >60)
Potassium: 3.1 mEq/L — ABNORMAL LOW (ref 3.5–5.1)

## 2010-11-23 MED ORDER — TOPIRAMATE 25 MG PO TABS
ORAL_TABLET | ORAL | Status: AC
  Filled 2010-11-23: qty 1

## 2010-11-23 MED ORDER — POTASSIUM CHLORIDE 10 MEQ/100ML IV SOLN
10.0000 meq | INTRAVENOUS | Status: AC
  Administered 2010-11-23 (×2): 10 meq via INTRAVENOUS
  Filled 2010-11-23 (×2): qty 100

## 2010-11-23 MED ORDER — FAMOTIDINE IN NACL 20-0.9 MG/50ML-% IV SOLN
INTRAVENOUS | Status: AC
  Filled 2010-11-23: qty 50

## 2010-11-23 MED ORDER — POTASSIUM CHLORIDE IN NACL 20-0.9 MEQ/L-% IV SOLN
INTRAVENOUS | Status: DC
  Administered 2010-11-23 – 2010-11-26 (×6): via INTRAVENOUS

## 2010-11-23 NOTE — Progress Notes (Signed)
POD#2  Abdomen is soft with hypoactive bowel sounds.  Dressing changed; wound is clean without drainage or sign of infection.  Urine outtput >50 cc/hr. Will d/c foley.  Labs note [K+] 3.1, will replace.  Doing well, will begin clamping NG tube and increase ambulation.  Filed Vitals:   11/23/10 0600  BP: 126/63  Pulse: 61  Temp: 98.5 F (36.9 C)  Resp: 18

## 2010-11-24 NOTE — Progress Notes (Signed)
Pt ambulated in hallways from room to nurses station; Pt tolerated well with no signs of problems; will ambulate again later

## 2010-11-24 NOTE — Progress Notes (Signed)
POD #3  Abdomen soft with good bowel sounds and passing flatus.  No distention and tolerating NG clamping.  Wound clean and changed.  Voiding well no leg pain or SOB.  Have removed NG tube will allow clear liquid diet and encourage ambulation.  Blood pressure 107/67, pulse 65, temperature 98.6 F (37 C), temperature source Oral, resp. rate 17, height 5\' 8"  (1.727 m), weight 56.7 kg (125 lb), SpO2 96.00%.

## 2010-11-24 NOTE — Progress Notes (Signed)
Filed Vitals:   11/24/10 0811  BP:   Pulse: 65  Temp:   Resp: 17   Pt has tolerated removal of NG tube without nausea and has been doing well.  She needs to ambulate. No acute surgical changes.

## 2010-11-24 NOTE — Progress Notes (Signed)
UR Chart Review Completed  

## 2010-11-25 NOTE — Progress Notes (Signed)
POD#4  Filed Vitals:   11/25/10 0544  BP: 143/70  Pulse: 55  Temp: 98.4 F (36.9 C)  Resp: 18   Abdomen soft passing and had BM this AM.  Wound clean and redressed.  Pt voiding well min discomfort.  Will advance diet and activity further.  Hopefully home in AM.

## 2010-11-25 NOTE — Progress Notes (Signed)
Doing well with full liquids; had 4 BMs.  Pain controlled with tylenol only.  Ambulated halls without problem and voiding well.  Filed Vitals:   11/25/10 1511  BP: 156/79  Pulse: 63  Temp: 98.1 F (36.7 C)  Resp: 18

## 2010-11-25 NOTE — Plan of Care (Signed)
Problem: Phase II Progression Outcomes Goal: Return of bowel function (flatus, BM) IF ABDOMINAL SURGERY:  Outcome: Completed/Met Date Met:  11/25/10 Four BMs today.

## 2010-11-26 MED ORDER — FAMOTIDINE 20 MG PO TABS
20.0000 mg | ORAL_TABLET | Freq: Two times a day (BID) | ORAL | Status: DC
  Administered 2010-11-26: 20 mg via ORAL
  Filled 2010-11-26: qty 1

## 2010-11-26 NOTE — Progress Notes (Signed)
The patient is receiving famotidine by the intravenous route.  Based on criteria approved by the Pharmacy and Therapeutics Committee and the Medical Executive Committee, the medication is being converted to the equivalent oral dose form.  These criteria include: -No Active GI bleeding -Able to tolerate diet of full liquids (or better) or tube feeding -Able to tolerate other medications by the oral or enteral route  If you have any questions about this conversion, please contact the Pharmacy Department (ext [phone]).  Thank you.  

## 2010-11-26 NOTE — Discharge Summary (Signed)
April Wade, GEISEL NO.:  [id number]  MEDICAL RECORD NO.:  [id number]  LOCATION:  A320                          FACILITY:  [facility]  PHYSICIAN:  Barbaraann Barthel, M.D. DATE OF BIRTH:  [birth year]  DATE OF ADMISSION:  11/21/2010 DATE OF DISCHARGE:  09/12/2012LH                              DISCHARGE SUMMARY   DIAGNOSIS:  Adenocarcinoma of the hepatic flexure.  PROCEDURE:  On November 21, 2010 right colectomy.  SECONDARY DIAGNOSES: 1. History of diabetes, controlled by diet. 2. History of chronic obstructive pulmonary disease and tobacco use. 3. Chronic headaches and back pain.  NOTE:  This is a 55 year old white female who was referred to my office from Dr. Karilyn Cota who after undergoing a routine colonoscopy, noted that she had several polyps, one of which was located in her hepatic flexure which was very suspicious for carcinoma.  The pathology revealed at least the very minimum severe dysplasia.  This is the impression of the pathologist as well as a gastroenterologist with that of cancer.  She was referred to me.  We discussed surgery and the need for a colectomy. We discussed complications not limited to, but including bleeding, infection and anastomotic leak, and informed consent was obtained.  This patient received a mechanical prep as an outpatient and then parenteral antibiotics preoperatively and then was admitted via the outpatient department where a right colectomy was carried out uneventfully.  This was a right colectomy with a stapled side-to-side anastomosis.  The patient postoperatively did very well.  On the third postoperative day, her NG tube was removed when she was passing flatus and had a bowel movement.  She continued to progress well on her diet and activity were advanced and at the time of discharged on the fifth postoperative day, she was tolerating p.o. well.  She was moving her bowels.  Her wound was clean without sign of any infection.   She had no leg pain or dysuria or shortness of breath and her wound was very clean. The patient had minimal discomfort.  She required very little analgesics.  This made her fact that last few days.  She was only taking Tylenol.  She was discharged therefore on the fifth postoperative day and her discharge instructions have been explained to her and as since she is to do no heavy lifting, no straining, no sex, no driving and she is told to clean her wound with alcohol and she is permitted to shower, and she is told not to use the swimming pool or [place].  We will follow up with her on Monday and she is told to contact me should she have any acute changes.  Her medications she was told to resume and those have been printed out for her and she is given no special pain medications or any other medications.  Her hospital course was completely uneventful. Final pathology is pending for this patient and we will make the appropriate referral to Oncology in the postoperative period.  The laboratory is postoperatively remained stable with an H and H of 10 and 30 thereabouts and her electrolytes immediately postoperatively, she had some hypokalemia, which was replaced postoperatively.  As stated, her hospital course  was uneventful.     Barbaraann Barthel, M.D.     WB/MEDQ  D:  11/26/2010  T:  11/26/2010  Job:  [id number]  cc:   Lionel December, M.D. Fax: [phone]  Ladona Horns. Mariel Sleet, MD Fax: [phone]

## 2010-11-26 NOTE — Progress Notes (Signed)
Discharge instructions given, verbalized understanding, out in stable condition via wheelchair with staff.

## 2010-11-26 NOTE — Progress Notes (Signed)
Discharge dictation#  [id number].

## 2010-11-26 NOTE — Progress Notes (Signed)
POD# 5  Pt continues to improve.  Min. Discomfort.  Abdomen is very soft and she is tolerating PO with nausea or bloating.  She is moving her bowels without problem.  Wound is clean and has been redressed.  Home today; D/C and F/U arranged.  Filed Vitals:   11/26/10 0546  BP: 126/71  Pulse: 66  Temp: 98.2 F (36.8 C)  Resp: 20     .

## 2010-11-27 ENCOUNTER — Encounter (HOSPITAL_COMMUNITY): Payer: Self-pay | Admitting: General Surgery

## 2010-12-07 NOTE — Progress Notes (Signed)
I have also reviewed her path report and lesion was 2.2 cm moderately well-differentiated adenocarcinoma

## 2010-12-17 ENCOUNTER — Encounter (HOSPITAL_COMMUNITY): Payer: Medicaid Other | Attending: Oncology | Admitting: Oncology

## 2010-12-17 ENCOUNTER — Encounter (HOSPITAL_COMMUNITY): Payer: Self-pay | Admitting: Oncology

## 2010-12-17 VITALS — BP 119/70 | HR 80 | Temp 98.4°F | Ht 67.5 in | Wt 112.4 lb

## 2010-12-17 DIAGNOSIS — J449 Chronic obstructive pulmonary disease, unspecified: Secondary | ICD-10-CM | POA: Insufficient documentation

## 2010-12-17 DIAGNOSIS — C189 Malignant neoplasm of colon, unspecified: Secondary | ICD-10-CM

## 2010-12-17 DIAGNOSIS — C183 Malignant neoplasm of hepatic flexure: Secondary | ICD-10-CM

## 2010-12-17 DIAGNOSIS — F79 Unspecified intellectual disabilities: Secondary | ICD-10-CM | POA: Insufficient documentation

## 2010-12-17 DIAGNOSIS — J4489 Other specified chronic obstructive pulmonary disease: Secondary | ICD-10-CM | POA: Insufficient documentation

## 2010-12-17 DIAGNOSIS — C760 Malignant neoplasm of head, face and neck: Secondary | ICD-10-CM

## 2010-12-17 DIAGNOSIS — F17200 Nicotine dependence, unspecified, uncomplicated: Secondary | ICD-10-CM | POA: Insufficient documentation

## 2010-12-17 LAB — COMPREHENSIVE METABOLIC PANEL
ALT: 15 U/L (ref 0–35)
AST: 16 U/L (ref 0–37)
Alkaline Phosphatase: 83 U/L (ref 39–117)
CO2: 31 mEq/L (ref 19–32)
GFR calc Af Amer: >90 mL/min (ref >90)
Glucose, Bld: 85 mg/dL (ref 70–99)
Potassium: 3.5 mEq/L (ref 3.5–5.1)
Sodium: 137 mEq/L (ref 135–145)
Total Protein: 7.3 g/dL (ref 6.0–8.3)

## 2010-12-17 LAB — CBC
HCT: 33.3 % — ABNORMAL LOW (ref 36.0–46.0)
Hemoglobin: 11.2 g/dL — ABNORMAL LOW (ref 12.0–15.0)
RDW: 14.7 % (ref 11.5–15.5)
WBC: 9 10*3/uL (ref 4.0–10.5)

## 2010-12-17 NOTE — Patient Instructions (Signed)
Patient not able to read or write.  Discharge instructions given verbally.  Patient verbalized understanding.

## 2010-12-17 NOTE — Progress Notes (Signed)
This office note has been dictated.

## 2010-12-18 ENCOUNTER — Other Ambulatory Visit (HOSPITAL_COMMUNITY): Payer: Self-pay | Admitting: Oncology

## 2010-12-18 ENCOUNTER — Telehealth (HOSPITAL_COMMUNITY): Payer: Self-pay | Admitting: *Deleted

## 2010-12-18 DIAGNOSIS — Z720 Tobacco use: Secondary | ICD-10-CM

## 2010-12-18 NOTE — Telephone Encounter (Signed)
Opened in error
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
## 2010-12-22 ENCOUNTER — Ambulatory Visit (HOSPITAL_COMMUNITY)
Admission: RE | Admit: 2010-12-22 | Discharge: 2010-12-22 | Disposition: A | Discharge status: Home / Self Care | Payer: Medicaid Other | Source: Ambulatory Visit | Attending: Oncology | Admitting: Oncology

## 2010-12-22 DIAGNOSIS — J984 Other disorders of lung: Secondary | ICD-10-CM | POA: Insufficient documentation

## 2010-12-22 DIAGNOSIS — C189 Malignant neoplasm of colon, unspecified: Secondary | ICD-10-CM

## 2010-12-22 DIAGNOSIS — F17200 Nicotine dependence, unspecified, uncomplicated: Secondary | ICD-10-CM | POA: Insufficient documentation

## 2010-12-22 DIAGNOSIS — Z1231 Encounter for screening mammogram for malignant neoplasm of breast: Secondary | ICD-10-CM | POA: Insufficient documentation

## 2010-12-22 DIAGNOSIS — Z720 Tobacco use: Secondary | ICD-10-CM

## 2010-12-24 ENCOUNTER — Telehealth (HOSPITAL_COMMUNITY): Payer: Self-pay

## 2010-12-24 NOTE — Telephone Encounter (Signed)
Patient notifed CT negative as requested per Dr. Mariel Sleet.

## 2011-03-11 ENCOUNTER — Other Ambulatory Visit (HOSPITAL_COMMUNITY): Payer: Medicaid Other

## 2011-07-14 ENCOUNTER — Other Ambulatory Visit (HOSPITAL_COMMUNITY): Payer: Self-pay | Admitting: General Surgery

## 2011-07-14 DIAGNOSIS — K469 Unspecified abdominal hernia without obstruction or gangrene: Secondary | ICD-10-CM

## 2011-07-16 ENCOUNTER — Ambulatory Visit (HOSPITAL_COMMUNITY)
Admission: RE | Admit: 2011-07-16 | Discharge: 2011-07-16 | Payer: Medicaid Other | Source: Ambulatory Visit | Attending: General Surgery | Admitting: General Surgery

## 2011-07-23 ENCOUNTER — Ambulatory Visit (HOSPITAL_COMMUNITY)
Admission: RE | Admit: 2011-07-23 | Discharge: 2011-07-23 | Disposition: A | Discharge status: Home / Self Care | Payer: Medicaid Other | Source: Ambulatory Visit | Attending: General Surgery | Admitting: General Surgery

## 2011-07-23 DIAGNOSIS — R1901 Right upper quadrant abdominal swelling, mass and lump: Secondary | ICD-10-CM | POA: Insufficient documentation

## 2011-07-23 DIAGNOSIS — Z85038 Personal history of other malignant neoplasm of large intestine: Secondary | ICD-10-CM | POA: Insufficient documentation

## 2011-07-23 DIAGNOSIS — K469 Unspecified abdominal hernia without obstruction or gangrene: Secondary | ICD-10-CM | POA: Insufficient documentation

## 2011-07-23 MED ORDER — IOHEXOL 300 MG/ML  SOLN
100.0000 mL | Freq: Once | INTRAMUSCULAR | Status: AC | PRN
  Administered 2011-07-23: 100 mL via INTRAVENOUS

## 2011-07-27 ENCOUNTER — Encounter (HOSPITAL_COMMUNITY): Payer: Self-pay | Admitting: Pharmacist

## 2011-07-29 NOTE — Consult Note (Signed)
NAMEMAEBEL, MARASCO NO.:  [id number]  MEDICAL RECORD NO.:  [id number]  LOCATION:                                 FACILITY:  PHYSICIAN:  Barbaraann Barthel, M.D. DATE OF BIRTH:  [birth year]  DATE OF CONSULTATION:  07/28/2011 DATE OF DISCHARGE:                                CONSULTATION   DIAGNOSIS:  Abdominal wall mass.  NOTE:  This is a 56 year old white female who had right colectomy for CA of the colon on August 2012.  She noted approximately 2 or 3 weeks ago, a mass-like effect just below the superior portion of the midline incision consistent either with a hernia or a local recurrence.  I obtained a CT scan that appeared to be that this was likely a local abdominal wall recurrence.  We will plan for a wide excision and repair of the abdominal wall possibly with the use of mesh.  We will try to obtain free margins and then as discussed with the Oncology Department, we will place a Port-A-Cath and plan for chemotherapy.  This was discussed in detail with the patient discussing complications, not limited to, but including bleeding, infection, and possibility of more surgery, and the possibility of hernia and the use of mesh.  Informed consent was obtained.  PAST MEDICAL HISTORY:  The patient has type 2 diabetes which is controlled with diet, history of COPD and continued tobacco use.  She has migraines and as noted, she had recently diagnosed adenocarcinoma of the right colon.  This was note, negative disease and it appears now that this may be a local recurrence through the surgical incision.  PAST SURGERIES:  A right colectomy in August 2012 and she had biopsy of a benign lesion on her face in the past.  She has otherwise had no previous surgery.  ALLERGIES:  She has no known allergies.  CURRENT MEDICATIONS:  Please consult the medical list for her current medications  SOCIAL HISTORY:  She is not an alcohol abuser.  She does smoke approximately 10  cigarettes per day.  PHYSICAL EXAMINATION:  VITAL SIGNS:  She is 5 feet 7 inches, weighs 118 pounds.  Temperature is 97.8, pulse is 60 per minute, respirations 12, blood pressure 130/66. HEENT:  Head:  Normocephalic.  Eyes:  Extraocular movements are intact. Pupils were round react to light and accommodation.  There is no conjunctive pallor or scleral injection.  The sclera is of normal tincture _________ .  Mouth and oral mucosa are moist.  The patient has dental prosthesis. NECK:  Supple and cylindrical without jugular vein distention, thyromegaly, or tracheal deviation.  I palpate no adenopathy. CHEST:  Clear. HEART:  Regular rhythm. BREASTS AND AXILLA:  Without masses. ABDOMEN:  There is about a 2-3 cm mass that I feel in the midline.  This is supraumbilically under the surgical incision. RECTAL:  Stool is guaiac-negative. EXTREMITIES:  There is some palpable bilateral groin adenopathy.  REVIEW OF SYSTEMS:  NEURO:  History of migraines.  No history of seizures.  No lateralizing or focal neurological defects appreciated. ENDOCRINE:  History of type 2 diabetes controlled with diet.  No history of thyroid disease.  CARDIOPULMONARY:  The  patient has a history of COPD and tobacco abuse.  MUSCULOSKELETAL:  Grossly within normal limits.  GI: Recent history of adenocarcinoma of the colon.  No history of hepatitis, constipation, diarrhea, or bright red rectal bleeding or melena or history of inflammatory bowel disease or irritable bowel syndrome. The patient has lost approximately 2 pounds since seen previously. Colonoscopy was done in 2012.  GU:  No frequency or dysuria or history of kidney stones.  OB/GYN HISTORY:  She is a gravida 3, para 2, abortus 1, cesarean 0 female.  Her last menstrual period was 8 years ago.  Her last mammogram was April 13.  DIAGNOSIS:  Abdominal wall.  We will biopsy this and try to obtain free margins.  I suspect that this may be a local recurrence.  We  have notified pathology for frozen section and to grossly check margins for clarity.  We will plan to do this via the outpatient department.     Barbaraann Barthel, M.D.     WB/MEDQ  D:  07/28/2011  T:  07/28/2011  Job:  [id number]  cc:   Ladona Horns. Mariel Sleet, MD Fax: [phone]  Lionel December, M.D. Fax: [phone]  Dr. Phillips Odor  Pathology Department

## 2011-07-29 NOTE — Patient Instructions (Signed)
[facility]  07/29/2011   Your procedure is scheduled on:  5.20.13  [facility] to [facility] [place] AM.  Call this number if you have problems the morning of surgery: [phone]   Remember:   Do not eat food:After Midnight.  May have clear liquids:until Midnight .  Clear liquids include soda, tea, black coffee, apple or grape juice, broth.  Take these medicines the morning of surgery with A SIP OF WATER: none   Do not wear jewelry, make-up or nail polish.  Do not wear lotions, powders, or perfumes. You may wear deodorant.  Do not shave 48 hours prior to surgery. Men may shave face and neck.  Do not bring valuables to the hospital.  Contacts, dentures or bridgework may not be worn into surgery.  Leave suitcase in the car. After surgery it may be brought to your room.  For patients admitted to the hospital, checkout time is 11:00 AM the day of discharge.   Patients discharged the day of surgery will not be allowed to drive home.  Name and phone number of your driver: family  Special Instructions: CHG Shower Use Special Wash: 1/2 bottle night before surgery and 1/2 bottle morning of surgery.   Please read over the following fact sheets that you were given: Pain Booklet, MRSA Information, Surgical Site Infection Prevention, Anesthesia Post-op Instructions and Care and Recovery After Surgery   PATIENT INSTRUCTIONS POST-ANESTHESIA  IMMEDIATELY FOLLOWING SURGERY:  Do not drive or operate machinery for the first twenty four hours after surgery.  Do not make any important decisions for twenty four hours after surgery or while taking narcotic pain medications or sedatives.  If you develop intractable nausea and vomiting or a severe headache please notify your doctor immediately.  FOLLOW-UP:  Please make an appointment with your surgeon as instructed. You do not need to follow up with anesthesia unless specifically instructed to do so.  WOUND CARE INSTRUCTIONS (if applicable):  Keep a dry  clean dressing on the anesthesia/puncture wound site if there is drainage.  Once the wound has quit draining you may leave it open to air.  Generally you should leave the bandage intact for twenty four hours unless there is drainage.  If the epidural site drains for more than 36-48 hours please call the anesthesia department.  QUESTIONS?:  Please feel free to call your physician or the hospital operator if you have any questions, and they will be happy to assist you.          Implanted Port Instructions An implanted port is a central line that has a round shape and is placed under the skin. It is used for long-term IV (intravenous) access for:  Medicine.   Fluids.   Liquid nutrition, such as TPN (total parenteral nutrition).   Blood samples.  Ports can be placed:  In the chest area just below the collarbone (this is the most common place.)   In the arms.   In the belly (abdomen) area.   In the legs.  PARTS OF THE PORT A port has 2 main parts:  The reservoir. The reservoir is round, disc-shaped, and will be a small, raised area under your skin.   The reservoir is the part where a needle is inserted (accessed) to either give medicines or to draw blood.   The catheter. The catheter is a long, slender tube that extends from the reservoir. The catheter is placed into a large vein.   Medicine that is inserted into  the reservoir goes into the catheter and then into the vein.  INSERTION OF THE PORT  The port is surgically placed in either an operating room or in a procedural area (interventional radiology).   Medicine may be given to help you relax during the procedure.   The skin where the port will be inserted is numbed (local anesthetic).   1 or 2 small cuts (incisions) will be made in the skin to insert the port.   The port can be used after it has been inserted.  INCISION SITE CARE  The incision site may have small adhesive strips on it. This helps keep the incision  site closed. Sometimes, no adhesive strips are placed. Instead of adhesive strips, a special kind of surgical glue is used to keep the incision closed.   If adhesive strips were placed on the incision sites, do not take them off. They will fall off on their own.   The incision site may be sore for 1 to 2 days. Pain medicine can help.   Do not get the incision site wet. Bathe or shower as directed by your caregiver.   The incision site should heal in 5 to 7 days. A small scar may form after the incision has healed.  ACCESSING THE PORT Special steps must be taken to access the port:  Before the port is accessed, a numbing cream can be placed on the skin. This helps numb the skin over the port site.   A sterile technique is used to access the port.   The port is accessed with a needle. Only "non-coring" port needles should be used to access the port. Once the port is accessed, a blood return should be checked. This helps ensure the port is in the vein and is not clogged (clotted).   If your caregiver believes your port should remain accessed, a clear (transparent) bandage will be placed over the needle site. The bandage and needle will need to be changed every week or as directed by your caregiver.   Keep the bandage covering the needle clean and dry. Do not get it wet. Follow your caregiver's instructions on how to take a shower or bath when the port is accessed.   If your port does not need to stay accessed, no bandage is needed over the port.  FLUSHING THE PORT Flushing the port keeps it from getting clogged. How often the port is flushed depends on:  If a constant infusion is running. If a constant infusion is running, the port may not need to be flushed.   If intermittent medicines are given.   If the port is not being used.  For intermittent medicines:  The port will need to be flushed:   After medicines have been given.   After blood has been drawn.   As part of routine  maintenance.   A port is normally flushed with:   Normal saline.   Heparin.   Follow your caregiver's advice on how often, how much, and the type of flush to use on your port.  IMPORTANT PORT INFORMATION  Tell your caregiver if you are allergic to heparin.   After your port is placed, you will get a manufacturer's information card. The card has information about your port. Keep this card with you at all times.   There are many types of ports available. Know what kind of port you have.   In case of an emergency, it may be helpful to wear a medical  alert bracelet. This can help alert health care workers that you have a port.   The port can stay in for as long as your caregiver believes it is necessary.   When it is time for the port to come out, surgery will be done to remove it. The surgery will be similar to how the port was put in.   If you are in the hospital or clinic:   Your port will be taken care of and flushed by a nurse.   If you are at home:   A home health care nurse may give medicines and take care of the port.   You or a family member can get special training and directions for giving medicine and taking care of the port at home.  SEEK IMMEDIATE MEDICAL CARE IF:   Your port does not flush or you are unable to get a blood return.   New drainage or pus is coming from the incision.   A bad smell is coming from the incision site.   You develop swelling or increased redness at the incision site.   You develop increased swelling or pain at the port site.   You develop swelling or pain in the surrounding skin near the port.   You have an oral temperature above 102 F (38.9 C), not controlled by medicine.  MAKE SURE YOU:   Understand these instructions.   Will watch your condition.   Will get help right away if you are not doing well or get worse.  Document Released: 03/02/2005 Document Revised: 02/19/2011 Document Reviewed: 05/24/2008 [facility] Patient  Information 2012 [place], [place].

## 2011-07-30 ENCOUNTER — Encounter (HOSPITAL_COMMUNITY)
Admission: RE | Admit: 2011-07-30 | Discharge: 2011-07-30 | Disposition: A | Discharge status: Home / Self Care | Payer: Medicaid Other | Source: Ambulatory Visit | Attending: General Surgery | Admitting: General Surgery

## 2011-07-30 ENCOUNTER — Encounter (HOSPITAL_COMMUNITY): Payer: Self-pay

## 2011-07-30 HISTORY — DX: Unspecified osteoarthritis, unspecified site: M19.90

## 2011-07-30 HISTORY — DX: Malignant (primary) neoplasm, unspecified: C80.1

## 2011-07-30 LAB — DIFFERENTIAL
Basophils Relative: 1 % (ref 0–1)
Lymphs Abs: 2.7 10*3/uL (ref 0.7–4.0)
Monocytes Absolute: 0.5 10*3/uL (ref 0.1–1.0)
Monocytes Relative: 8 % (ref 3–12)
Neutro Abs: 3.3 10*3/uL (ref 1.7–7.7)

## 2011-07-30 LAB — CBC
HCT: 36.9 % (ref 36.0–46.0)
Hemoglobin: 12.3 g/dL (ref 12.0–15.0)
MCH: 30.8 pg (ref 26.0–34.0)
MCHC: 33.3 g/dL (ref 30.0–36.0)
RBC: 3.99 MIL/uL (ref 3.87–5.11)

## 2011-07-30 LAB — BASIC METABOLIC PANEL
BUN: 6 mg/dL (ref 6–23)
Chloride: 102 mEq/L (ref 96–112)
Creatinine, Ser: 0.74 mg/dL (ref 0.50–1.10)
GFR calc Af Amer: >90 mL/min (ref >90)
Glucose, Bld: 69 mg/dL — ABNORMAL LOW (ref 70–99)
Potassium: 3.6 mEq/L (ref 3.5–5.1)

## 2011-08-03 ENCOUNTER — Encounter (HOSPITAL_COMMUNITY)
Admission: RE | Disposition: A | Discharge status: Home / Self Care | Payer: Self-pay | Source: Ambulatory Visit | Attending: General Surgery

## 2011-08-03 ENCOUNTER — Encounter (HOSPITAL_COMMUNITY): Payer: Self-pay | Admitting: Anesthesiology

## 2011-08-03 ENCOUNTER — Ambulatory Visit (HOSPITAL_COMMUNITY)
Admission: RE | Admit: 2011-08-03 | Discharge: 2011-08-04 | Disposition: A | Discharge status: Home / Self Care | Payer: Medicaid Other | Source: Ambulatory Visit | Attending: General Surgery | Admitting: General Surgery

## 2011-08-03 ENCOUNTER — Ambulatory Visit (HOSPITAL_COMMUNITY): Payer: Medicaid Other | Admitting: Anesthesiology

## 2011-08-03 ENCOUNTER — Encounter (HOSPITAL_COMMUNITY): Payer: Self-pay | Admitting: *Deleted

## 2011-08-03 DIAGNOSIS — E119 Type 2 diabetes mellitus without complications: Secondary | ICD-10-CM | POA: Insufficient documentation

## 2011-08-03 DIAGNOSIS — J449 Chronic obstructive pulmonary disease, unspecified: Secondary | ICD-10-CM | POA: Insufficient documentation

## 2011-08-03 DIAGNOSIS — R1900 Intra-abdominal and pelvic swelling, mass and lump, unspecified site: Secondary | ICD-10-CM | POA: Insufficient documentation

## 2011-08-03 DIAGNOSIS — Z9049 Acquired absence of other specified parts of digestive tract: Secondary | ICD-10-CM | POA: Insufficient documentation

## 2011-08-03 DIAGNOSIS — Z85038 Personal history of other malignant neoplasm of large intestine: Secondary | ICD-10-CM | POA: Insufficient documentation

## 2011-08-03 DIAGNOSIS — Z01812 Encounter for preprocedural laboratory examination: Secondary | ICD-10-CM | POA: Insufficient documentation

## 2011-08-03 DIAGNOSIS — R222 Localized swelling, mass and lump, trunk: Secondary | ICD-10-CM

## 2011-08-03 DIAGNOSIS — J4489 Other specified chronic obstructive pulmonary disease: Secondary | ICD-10-CM | POA: Insufficient documentation

## 2011-08-03 HISTORY — PX: MASS EXCISION: SHX2000

## 2011-08-03 HISTORY — PX: ABDOMINAL WALL DEFECT REPAIR: SHX53

## 2011-08-03 LAB — GLUCOSE, CAPILLARY: Glucose-Capillary: 105 mg/dL — ABNORMAL HIGH (ref 70–99)

## 2011-08-03 SURGERY — EXCISION MASS
Anesthesia: General | Site: Abdomen | Wound class: Clean

## 2011-08-03 MED ORDER — ONDANSETRON HCL 4 MG PO TABS: 4.0000 mg | ORAL_TABLET | Freq: Four times a day (QID) | ORAL | Status: DC | PRN

## 2011-08-03 MED ORDER — CEFAZOLIN SODIUM 1-5 GM-% IV SOLN
1.0000 g | INTRAVENOUS | Status: AC
  Administered 2011-08-03: 1 g via INTRAVENOUS

## 2011-08-03 MED ORDER — LIDOCAINE HCL (CARDIAC) 10 MG/ML IV SOLN
INTRAVENOUS | Status: DC | PRN
  Administered 2011-08-03: 20 mg via INTRAVENOUS

## 2011-08-03 MED ORDER — MIDAZOLAM HCL 2 MG/2ML IJ SOLN
1.0000 mg | INTRAMUSCULAR | Status: DC | PRN
Start: 2011-08-03 — End: 2011-08-03
  Administered 2011-08-03 (×2): 2 mg via INTRAVENOUS

## 2011-08-03 MED ORDER — CEFAZOLIN SODIUM 1-5 GM-% IV SOLN
INTRAVENOUS | Status: AC
  Filled 2011-08-03: qty 50

## 2011-08-03 MED ORDER — GLYCOPYRROLATE 0.2 MG/ML IJ SOLN
INTRAMUSCULAR | Status: DC | PRN
  Administered 2011-08-03: 0.2 mg via INTRAVENOUS

## 2011-08-03 MED ORDER — BACITRACIN 50000 UNITS IM SOLR
INTRAMUSCULAR | Status: AC
  Filled 2011-08-03: qty 1

## 2011-08-03 MED ORDER — ONDANSETRON HCL 4 MG/2ML IJ SOLN
4.0000 mg | Freq: Four times a day (QID) | INTRAMUSCULAR | Status: DC | PRN
  Administered 2011-08-03 – 2011-08-04 (×3): 4 mg via INTRAVENOUS
  Filled 2011-08-03 (×3): qty 2

## 2011-08-03 MED ORDER — PROPOFOL 10 MG/ML IV EMUL
INTRAVENOUS | Status: AC
  Filled 2011-08-03: qty 20

## 2011-08-03 MED ORDER — PROPOFOL 10 MG/ML IV EMUL
INTRAVENOUS | Status: DC | PRN
  Administered 2011-08-03 (×2): 20 mg via INTRAVENOUS
  Administered 2011-08-03: 150 mg via INTRAVENOUS

## 2011-08-03 MED ORDER — LIDOCAINE HCL (PF) 1 % IJ SOLN
INTRAMUSCULAR | Status: AC
  Filled 2011-08-03: qty 5

## 2011-08-03 MED ORDER — FENTANYL CITRATE 0.05 MG/ML IJ SOLN
25.0000 ug | INTRAMUSCULAR | Status: DC | PRN
  Administered 2011-08-03 (×2): 50 ug via INTRAVENOUS

## 2011-08-03 MED ORDER — FENTANYL CITRATE 0.05 MG/ML IJ SOLN
INTRAMUSCULAR | Status: DC | PRN
  Administered 2011-08-03: 25 ug via INTRAVENOUS
  Administered 2011-08-03: 50 ug via INTRAVENOUS
  Administered 2011-08-03: 25 ug via INTRAVENOUS

## 2011-08-03 MED ORDER — FENTANYL CITRATE 0.05 MG/ML IJ SOLN
INTRAMUSCULAR | Status: AC
  Filled 2011-08-03: qty 2

## 2011-08-03 MED ORDER — LACTATED RINGERS IV SOLN
INTRAVENOUS | Status: DC
  Administered 2011-08-03: 1000 mL via INTRAVENOUS

## 2011-08-03 MED ORDER — MORPHINE SULFATE 2 MG/ML IJ SOLN
1.0000 mg | INTRAMUSCULAR | Status: DC | PRN
  Administered 2011-08-03 – 2011-08-04 (×4): 1 mg via INTRAVENOUS
  Filled 2011-08-03 (×4): qty 1

## 2011-08-03 MED ORDER — FENTANYL CITRATE 0.05 MG/ML IJ SOLN
INTRAMUSCULAR | Status: AC
  Administered 2011-08-03: 50 ug via INTRAVENOUS
  Filled 2011-08-03: qty 2

## 2011-08-03 MED ORDER — BUPIVACAINE HCL (PF) 0.5 % IJ SOLN
INTRAMUSCULAR | Status: AC
  Filled 2011-08-03: qty 30

## 2011-08-03 MED ORDER — STERILE WATER FOR IRRIGATION IR SOLN
Status: DC | PRN
  Administered 2011-08-03: 2000 mL

## 2011-08-03 MED ORDER — MIDAZOLAM HCL 2 MG/2ML IJ SOLN
INTRAMUSCULAR | Status: AC
  Administered 2011-08-03: 2 mg via INTRAVENOUS
  Filled 2011-08-03: qty 2

## 2011-08-03 MED ORDER — HEPARIN SOD (PORK) LOCK FLUSH 100 UNIT/ML IV SOLN
INTRAVENOUS | Status: AC
  Filled 2011-08-03: qty 5

## 2011-08-03 MED ORDER — BUPIVACAINE HCL 0.5 % IJ SOLN
INTRAMUSCULAR | Status: DC | PRN
  Administered 2011-08-03: 17 mL

## 2011-08-03 MED ORDER — ONDANSETRON HCL 4 MG/2ML IJ SOLN: 4.0000 mg | Freq: Once | INTRAMUSCULAR | Status: DC | PRN

## 2011-08-03 MED ORDER — POTASSIUM CHLORIDE IN NACL 20-0.9 MEQ/L-% IV SOLN
INTRAVENOUS | Status: DC
  Administered 2011-08-03 – 2011-08-04 (×2): via INTRAVENOUS

## 2011-08-03 MED ORDER — HEPARIN SODIUM (PORCINE) 1000 UNIT/ML IJ SOLN
INTRAMUSCULAR | Status: AC
  Filled 2011-08-03: qty 1

## 2011-08-03 MED ORDER — 0.9 % SODIUM CHLORIDE (POUR BTL) OPTIME
TOPICAL | Status: DC | PRN
  Administered 2011-08-03: 1000 mL

## 2011-08-03 MED ORDER — GLYCOPYRROLATE 0.2 MG/ML IJ SOLN
INTRAMUSCULAR | Status: AC
  Filled 2011-08-03: qty 1

## 2011-08-03 MED ORDER — LIDOCAINE HCL (PF) 1 % IJ SOLN
INTRAMUSCULAR | Status: AC
  Filled 2011-08-03: qty 30

## 2011-08-03 SURGICAL SUPPLY — 58 items
104207 ×3 IMPLANT
APPLIER CLIP 9.375 SM OPEN (CLIP)
BAG DECANTER FOR FLEXI CONT (MISCELLANEOUS) ×3 IMPLANT
BAG HAMPER (MISCELLANEOUS) ×3 IMPLANT
CLIP APPLIE 9.375 SM OPEN (CLIP) IMPLANT
CLOTH BEACON ORANGE TIMEOUT ST (SAFETY) ×3 IMPLANT
COVER LIGHT HANDLE STERIS (MISCELLANEOUS) ×6 IMPLANT
DECANTER SPIKE VIAL GLASS SM (MISCELLANEOUS) ×3 IMPLANT
DRAPE C-ARM FOLDED MOBILE STRL (DRAPES) ×3 IMPLANT
DRSG TEGADERM 2-3/8X2-3/4 SM (GAUZE/BANDAGES/DRESSINGS) IMPLANT
DRSG TEGADERM 4X4.75 (GAUZE/BANDAGES/DRESSINGS) ×3 IMPLANT
ELECT NEEDLE TIP 2.8 STRL (NEEDLE) IMPLANT
ELECT REM PT RETURN 9FT ADLT (ELECTROSURGICAL) ×3
ELECTRODE REM PT RTRN 9FT ADLT (ELECTROSURGICAL) ×2 IMPLANT
FORMALIN 10 PREFIL 120ML (MISCELLANEOUS) ×3 IMPLANT
GAUZE SPONGE 4X4 16PLY XRAY LF (GAUZE/BANDAGES/DRESSINGS) ×3 IMPLANT
GLOVE BIOGEL PI IND STRL 7.0 (GLOVE) ×4 IMPLANT
GLOVE BIOGEL PI INDICATOR 7.0 (GLOVE) ×2
GLOVE ECLIPSE 6.5 STRL STRAW (GLOVE) ×6 IMPLANT
GLOVE EXAM NITRILE MD LF STRL (GLOVE) ×3 IMPLANT
GLOVE SKINSENSE NS SZ7.0 (GLOVE) ×1
GLOVE SKINSENSE STRL SZ7.0 (GLOVE) ×2 IMPLANT
GOWN STRL REIN XL XLG (GOWN DISPOSABLE) ×9 IMPLANT
IV NS 500ML (IV SOLUTION) ×1
IV NS 500ML BAXH (IV SOLUTION) ×2 IMPLANT
KIT PORT POWER 8FR ISP MRI (CATHETERS) IMPLANT
KIT ROOM TURNOVER APOR (KITS) ×3 IMPLANT
MANIFOLD NEPTUNE II (INSTRUMENTS) ×3 IMPLANT
NEEDLE HYPO 18GX1.5 BLUNT FILL (NEEDLE) ×3 IMPLANT
NEEDLE HYPO 25X1 1.5 SAFETY (NEEDLE) ×3 IMPLANT
NS IRRIG 1000ML POUR BTL (IV SOLUTION) ×3 IMPLANT
PACK MINOR (CUSTOM PROCEDURE TRAY) ×3 IMPLANT
PAD ARMBOARD 7.5X6 YLW CONV (MISCELLANEOUS) ×3 IMPLANT
PATCH VENTRAL SMALL 4.3 (Mesh Specialty) ×3 IMPLANT
SET BASIN LINEN APH (SET/KITS/TRAYS/PACK) ×3 IMPLANT
SHEATH COOK PEEL AWAY SET 8F (SHEATH) IMPLANT
SOL PREP PROV IODINE SCRUB 4OZ (MISCELLANEOUS) ×3 IMPLANT
SPONGE GAUZE 2X2 8PLY STRL LF (GAUZE/BANDAGES/DRESSINGS) IMPLANT
SPONGE GAUZE 4X4 12PLY (GAUZE/BANDAGES/DRESSINGS) IMPLANT
STAPLER VISISTAT 35W (STAPLE) ×3 IMPLANT
STRIP CLOSURE SKIN 1/4X3 (GAUZE/BANDAGES/DRESSINGS) IMPLANT
SUT ETHILON 3 0 FSL (SUTURE) ×3 IMPLANT
SUT PROLENE 0 CT 1 CR/8 (SUTURE) ×3 IMPLANT
SUT PROLENE 4 0 PS 2 18 (SUTURE) IMPLANT
SUT VIC AB 3-0 SH 27 (SUTURE) ×1
SUT VIC AB 3-0 SH 27X BRD (SUTURE) ×2 IMPLANT
SUT VIC AB 4-0 PS2 27 (SUTURE) ×3 IMPLANT
SUT VIC AB 4-0 SH 27 (SUTURE)
SUT VIC AB 4-0 SH 27XBRD (SUTURE) IMPLANT
SUT VIC AB 5-0 P-3 18X BRD (SUTURE) IMPLANT
SUT VIC AB 5-0 P3 18 (SUTURE)
SYR 20CC LL (SYRINGE) ×3 IMPLANT
SYR 5ML LL (SYRINGE) ×3 IMPLANT
SYR BULB IRRIGATION 50ML (SYRINGE) ×3 IMPLANT
SYR CONTROL 10ML LL (SYRINGE) ×3 IMPLANT
TAPE CLOTH SURG 4X10 WHT LF (GAUZE/BANDAGES/DRESSINGS) ×3 IMPLANT
TOWEL OR 17X26 4PK STRL BLUE (TOWEL DISPOSABLE) ×3 IMPLANT
WATER STERILE IRR 1000ML POUR (IV SOLUTION) ×6 IMPLANT

## 2011-08-03 NOTE — Progress Notes (Signed)
Post OP Check  Awake and alert.  Dressings dry and in tact.  Some expected incisional pain. Has voided. Doing well post op.  Home in AM.  Filed Vitals:   08/03/11 1450  BP: 151/73  Pulse: 54  Temp: 99 F (37.2 C)  Resp: 16

## 2011-08-03 NOTE — Anesthesia Postprocedure Evaluation (Signed)
Anesthesia Post Note  Patient: April Wade  Procedure(s) Performed: Procedure(s) (LRB): EXCISION MASS (N/A)  Anesthesia type: General  Patient location: PACU  Post pain: Pain level controlled  Post assessment: Post-op Vital signs reviewed, Patient's Cardiovascular Status Stable, Respiratory Function Stable, Patent Airway, No signs of Nausea or vomiting and Pain level controlled  Last Vitals:  Filed Vitals:   08/03/11 0944  BP: 147/80  Pulse: 74  Temp: 36.9 C  Resp: 16    Post vital signs: Reviewed and stable  Level of consciousness: awake and alert   Complications: No apparent anesthesia complications

## 2011-08-03 NOTE — Op Note (Signed)
April Wade, April Wade NO.:  [id number]  MEDICAL RECORD NO.:  [id number]  LOCATION:  [facility]                          FACILITY:  [facility]  PHYSICIAN:  Barbaraann Barthel, M.D. DATE OF BIRTH:  10/06/1955  DATE OF PROCEDURE:  08/03/2011 DATE OF DISCHARGE:                              OPERATIVE REPORT   DIAGNOSIS:  Abdominal wall mass with history of right colectomy in August 2012.  PROCEDURE:  Excision of abdominal wall mass with repair of abdominal wall defect with 4.3 x 4.3 cm Ethicon ventral Proceed patch mesh.  NOTE:  This is a 56 year old white female, who had a history of colon cancer and underwent an uneventful right colectomy in August 2012.  This was a node-negative cancer as I recall and she on followup, presented to the office with a midline mass just below the superior portion of the incision.  This did not have any erythema associated with it and appeared as a nodule.  We performed a CT scan to make sure we were not dealing with hernia and this appeared to be a nodule in the subcutaneous area right adjacent to the fascia.  We decided then to do a wide excision to rule out the presence of carcinoma with local recurrence in this area.  We also planned to place a Port-A-Cath if malignancy were encountered.  We discussed complications, not limited to, but including bleeding, infection, and hernia and the possibility of further surgery might be required.  Informed consent was obtained.  TECHNIQUE:  The patient was placed in supine position and after the adequate administration of LMA anesthesia, a Foley catheter was aseptically inserted.  Her abdomen was prepped with Betadine solution and draped in usual manner.  An elliptical skin incision was carried out over the mass that was about 2 cm in diameter, and we excised this down to the fascia and removed this entirely leaving abdominal wall defect that we could not approximate primarily.  We sent this off to  Pathology. It appeared to be an inflammatory response and not any malignancy was encountered.  I did send the specimen with a suture placed on the superior right margin for orientation for Pathology.  There was no pus. There was no inflammation noted.  I then irrigated the area and then placed a 4.3 x 4.3 circular Proceed ventral patch below the fascia and sutured this in place with interrupted 0 Prolene sutures.  After this was done, I then closed some of the subcutaneous tissue that was possible over the patch and then infiltrated circumferentially with 0.5% Sensorcaine for postoperative comfort.  Prior to closure, all sponge, needle, and instrument counts were found to be correct.  We did close the skin defect with staples.  No drain was placed.  There were no complications.  Estimated blood loss was less than 50 mL.  The patient received approximately 800 mL of crystalloids intraoperatively.  There were no complications.     Barbaraann Barthel, M.D.     WB/MEDQ  D:  08/03/2011  T:  08/03/2011  Job:  [id number]  cc:   Ladona Horns. Mariel Sleet, MD Fax: [phone]

## 2011-08-03 NOTE — Anesthesia Procedure Notes (Signed)
Procedure Name: LMA Insertion Date/Time: 08/03/2011 8:07 AM Performed by: Franco Nones Pre-anesthesia Checklist: Patient identified, Patient being monitored, Emergency Drugs available, Timeout performed and Suction available Patient Re-evaluated:Patient Re-evaluated prior to inductionOxygen Delivery Method: Circle System Utilized Preoxygenation: Pre-oxygenation with 100% oxygen Intubation Type: IV induction Ventilation: Mask ventilation without difficulty LMA: LMA inserted LMA Size: 4.0 and 3.0 Number of attempts: 3 Placement Confirmation: positive ETCO2 and breath sounds checked- equal and bilateral Tube secured with: Tape Dental Injury: Teeth and Oropharynx as per pre-operative assessment  Comments: Attempted  Insertion x 2 with 4 LMA.  Head repositioned between attempts. Pt. Had hair in ponytail.  Band around hair loosened and head/neck extension better. Successful insertion 3 LMA

## 2011-08-03 NOTE — Transfer of Care (Signed)
Immediate Anesthesia Transfer of Care Note  Patient: April Wade  Procedure(s) Performed: Procedure(s) (LRB): EXCISION MASS (N/A)  Patient Location: PACU  Anesthesia Type: General  Level of Consciousness: awake  Airway & Oxygen Therapy: Patient Spontanous Breathing and non-rebreather face mask  Post-op Assessment: Report given to PACU RN, Post -op Vital signs reviewed and stable and Patient moving all extremities  Post vital signs: Reviewed and stable  Complications: No apparent anesthesia complications

## 2011-08-03 NOTE — Anesthesia Preprocedure Evaluation (Signed)
Anesthesia Evaluation  Patient identified by MRN, date of birth, ID band Patient awake    Reviewed: Allergy & Precautions, H&P , NPO status , Patient's Chart, lab work & pertinent test results  Airway Mallampati: I TM Distance: >3 FB Neck ROM: Full    Dental  (+) Edentulous Upper and Edentulous Lower   Pulmonary shortness of breath, asthma , COPD COPD inhaler,  breath sounds clear to auscultation        Cardiovascular negative cardio ROS  Rhythm:Regular Rate:Normal     Neuro/Psych  Headaches, negative psych ROS   GI/Hepatic negative GI ROS, Neg liver ROS,   Endo/Other  Diabetes mellitus-, Well Controlled, Type 2  Renal/GU negative Renal ROS     Musculoskeletal negative musculoskeletal ROS (+)   Abdominal Normal abdominal exam  (+)   Peds  Hematology  (+) Blood dyscrasia, anemia ,   Anesthesia Other Findings   Reproductive/Obstetrics                           Anesthesia Physical Anesthesia Plan  ASA: III  Anesthesia Plan: General   Post-op Pain Management:    Induction: Intravenous  Airway Management Planned: LMA  Additional Equipment:   Intra-op Plan:   Post-operative Plan: Extubation in OR  Informed Consent: I have reviewed the patients History and Physical, chart, labs and discussed the procedure including the risks, benefits and alternatives for the proposed anesthesia with the patient or authorized representative who has indicated his/her understanding and acceptance.     Plan Discussed with:   Anesthesia Plan Comments:         Anesthesia Quick Evaluation

## 2011-08-03 NOTE — Brief Op Note (Signed)
08/03/2011  9:44 AM  PATIENT:  April Wade  56 y.o. female  PRE-OPERATIVE DIAGNOSIS:  abdominal wall mass, colon cancer  POST-OPERATIVE DIAGNOSIS:  abdominal wall mass, colon cancer  PROCEDURE:  Procedure(s) (LRB): EXCISION MASS (N/A)  SURGEON:  Surgeon(s) and Role:    * Marlane Hatcher, MD - Primary  PHYSICIAN ASSISTANT:   ASSISTANTS: none   ANESTHESIA:   general  EBL:  Total I/O In: 850 [I.V.:850] Out: 810 [Urine:800; Blood:10]  BLOOD ADMINISTERED:none  DRAINS: none   LOCAL MEDICATIONS USED:  MARCAINE  17 cc   SPECIMEN:  Source of Specimen:  abdominal wall midline (suture marking sup Right margin)  Frozen section neg for malignancy  DISPOSITION OF SPECIMEN:  PATHOLOGY  COUNTS:  YES  TOURNIQUET:  * No tourniquets in log *  DICTATION: .Other Dictation: Dictation Number OR dict. # [id number]  PLAN OF CARE: Admit for overnight observation  PATIENT DISPOSITION:  PACU - hemodynamically stable.   Delay start of Pharmacological VTE agent (>24hrs) due to surgical blood loss or risk of bleeding: not applicable

## 2011-08-03 NOTE — Progress Notes (Signed)
56 yr old W. Female with Hx of CA of colon, treated with R. Colectomy in Aug. 2012.   Presented with palpable mass in midline below surgical scar.  CT scan shows nodule subcutaneous and not hernia.  Will perform wide excision thinking that this is a local recurrence. Path. has been notified to check margins of resection.  If this is local recurrence will place porta cath for chemo, as previously discussed with oncology.  Labs reviewed. Procedure and risks and consent addressed with pt.  Filed Vitals:   08/03/11 0715  BP: 120/66  Pulse:   Temp:   Resp: 12  pulse: 60/min, temp 97.6.  No clinical change since H&P.  Dict. # [id number].

## 2011-08-04 MED ORDER — DOCUSATE SODIUM 100 MG PO CAPS
100.0000 mg | ORAL_CAPSULE | Freq: Once | ORAL | Status: AC
  Administered 2011-08-04: 100 mg via ORAL
  Filled 2011-08-04: qty 1

## 2011-08-04 MED ORDER — OXYCODONE-ACETAMINOPHEN 5-325 MG PO TABS: 1.0000 | ORAL_TABLET | ORAL | Status: AC | PRN

## 2011-08-04 MED ORDER — DOCUSATE SODIUM 100 MG PO CAPS: 100.0000 mg | ORAL_CAPSULE | Freq: Two times a day (BID) | ORAL | Status: AC

## 2011-08-04 NOTE — Discharge Summary (Signed)
NAMEEARSIE, HUMM NO.:  [id number]  MEDICAL RECORD NO.:  [id number]  LOCATION:  [facility]                          FACILITY:  [facility]  PHYSICIAN:  Barbaraann Barthel, M.D. DATE OF BIRTH:  [birth year]  DATE OF ADMISSION:  08/03/2011 DATE OF DISCHARGE:  05/21/2013LH                              DISCHARGE SUMMARY   DIAGNOSIS:  Abdominal wall mass.  Excision of abdominal wall mass with repair of abdominal wall defect with mesh prosthesis on Aug 03, 2011.  NOTE:  This is a 56 year old white female who had a history of adenocarcinoma of the right colon, this was resected this was treated with a right colectomy in August of 2012.  She presented with a mass effect in the superior portion of her incision.  This did not appear inflamed and there was no drainage.  This felt hard.  I did not think clinically that this was a hernia.  I did obtain a CT scan, this appeared to be have masslike effect.  I discussed this with Oncology with a plan for a wide excision with clean margins thinking that this was a local recurrence and also plan to place a Port-A-Cath for chemotherapy should frozen section reveal this to be an indeed local recurrence of her adenocarcinoma.  We took her to surgery via the Outpatient Department on Aug 03, 2011, where I excised a fibrous hardened mass in the superior portion of her incision and I removed elliptical skin all the way down removing the entire abdominal wall that was involved elliptically in the thought that this was a cancer.  Frozen section revealed inflammatory changes only. There was no sign of any cancer in any of the specimen.  I then repaired the abdominal wall defect with a 4.3 circular Proceed type of mesh graft, this was sutured in place with 0 Prolene sutures.  The patient did well.  She was admitted overnight for observation and she was discharged on the following day.  At this time, she was tolerating p.o. well.  She was voiding  without dysuria.  She had no leg pain or shortness of breath, and her wound was clean.  She had some incisional pain, and we had planned to take care of this with some p.o. oxycodone and we will follow her perioperatively.  She is told to contact us should there be any acute changes.  Final pathology is pending and left for laboratory data, please see her medical record.     Barbaraann Barthel, M.D.     WB/MEDQ  D:  08/04/2011  T:  08/04/2011  Job:  [id number]  cc:   Ladona Horns. Mariel Sleet, MD Fax: [phone]

## 2011-08-04 NOTE — Care Management Note (Signed)
    Page 1 of 1   08/04/2011     10:48:06 AM   CARE MANAGEMENT NOTE 08/04/2011  Patient:  April Wade, April Wade   Account Number:  [id number]  Date Initiated:  08/04/2011  Documentation initiated by:  Sharrie Rothman  Subjective/Objective Assessment:   Pt admitted from home with abd wall mass. Pt lives with husband and is independent with ADL's PTA. Pt is s/p wide excision of mass. Pt will return home at discharge.     Action/Plan:   Pt has no CM needs.   Anticipated DC Date:  08/04/2011   Anticipated DC Plan:  HOME/SELF CARE      DC Planning Services  CM consult      Choice offered to / List presented to:             Status of service:  Completed, signed off Medicare Important Message given?   (If response is "NO", the following Medicare IM given date fields will be blank) Date Medicare IM given:   Date Additional Medicare IM given:    Discharge Disposition:  HOME/SELF CARE  Per UR Regulation:    If discussed at Long Length of Stay Meetings, dates discussed:    Comments:  08/04/11 1047 Arlyss Queen, RN BSN CM

## 2011-08-04 NOTE — Plan of Care (Signed)
Problem: Discharge Progression Outcomes Goal: Discharge plan in place and appropriate Pt d/c home with family d/c instructions given to pt and family both verbalize understanding of instruction

## 2011-08-04 NOTE — Progress Notes (Signed)
POD # 1  Pt doing well post op.   Her wound is clean and was redressed.  Pt. Voiding well, no leg pain or unusual shortness of breath.    D/C and follow up arranged.  Filed Vitals:   08/04/11 0640  BP: 134/61  Pulse: 56  Temp: 98.8 F (37.1 C)  Resp:    Discharge dict. # [id number].

## 2011-08-04 NOTE — Anesthesia Postprocedure Evaluation (Signed)
  Anesthesia Post-op Note  Patient: April Wade  Procedure(s) Performed: Procedure(s) (LRB): EXCISION MASS (N/A) INSERTION OF MESH (N/A) REPAIR ABDOMINAL WALL (N/A)  Patient Location:Room 326  Anesthesia Type: General  Level of Consciousness: awake, alert , oriented and patient cooperative  Airway and Oxygen Therapy: Patient Spontanous Breathing and Patient connected to nasal cannula oxygen  Post-op Pain: mild  Post-op Assessment: Post-op Vital signs reviewed, Patient's Cardiovascular Status Stable, Respiratory Function Stable, Patent Airway, Adequate PO intake and Pain level controlled  Post-op Vital Signs: Reviewed and stable  Complications: No apparent anesthesia complications

## 2011-08-07 MED FILL — Lidocaine HCl Local Preservative Free (PF) Inj 1%: INTRAMUSCULAR | Qty: 30 | Status: AC

## 2011-08-12 ENCOUNTER — Encounter (HOSPITAL_COMMUNITY): Payer: Self-pay | Admitting: General Surgery

## 2011-11-18 ENCOUNTER — Other Ambulatory Visit (HOSPITAL_COMMUNITY): Payer: Self-pay | Admitting: Family Medicine

## 2011-11-18 DIAGNOSIS — N63 Unspecified lump in unspecified breast: Secondary | ICD-10-CM

## 2011-11-18 DIAGNOSIS — Z139 Encounter for screening, unspecified: Secondary | ICD-10-CM

## 2011-12-16 ENCOUNTER — Encounter (HOSPITAL_COMMUNITY): Payer: Self-pay | Admitting: Oncology

## 2011-12-16 ENCOUNTER — Telehealth (INDEPENDENT_AMBULATORY_CARE_PROVIDER_SITE_OTHER): Payer: Self-pay | Admitting: *Deleted

## 2011-12-16 ENCOUNTER — Other Ambulatory Visit (HOSPITAL_COMMUNITY): Payer: Self-pay | Admitting: Oncology

## 2011-12-16 ENCOUNTER — Encounter (HOSPITAL_COMMUNITY): Payer: Medicaid Other | Attending: Oncology | Admitting: Oncology

## 2011-12-16 VITALS — BP 138/66 | HR 76 | Temp 98.0°F | Resp 18 | Wt 113.4 lb

## 2011-12-16 DIAGNOSIS — C183 Malignant neoplasm of hepatic flexure: Secondary | ICD-10-CM

## 2011-12-16 DIAGNOSIS — R928 Other abnormal and inconclusive findings on diagnostic imaging of breast: Secondary | ICD-10-CM

## 2011-12-16 DIAGNOSIS — F79 Unspecified intellectual disabilities: Secondary | ICD-10-CM | POA: Insufficient documentation

## 2011-12-16 DIAGNOSIS — J449 Chronic obstructive pulmonary disease, unspecified: Secondary | ICD-10-CM

## 2011-12-16 DIAGNOSIS — J4489 Other specified chronic obstructive pulmonary disease: Secondary | ICD-10-CM | POA: Insufficient documentation

## 2011-12-16 DIAGNOSIS — C189 Malignant neoplasm of colon, unspecified: Secondary | ICD-10-CM

## 2011-12-16 DIAGNOSIS — E876 Hypokalemia: Secondary | ICD-10-CM

## 2011-12-16 HISTORY — DX: Malignant neoplasm of colon, unspecified: C18.9

## 2011-12-16 LAB — CBC WITH DIFFERENTIAL/PLATELET
Basophils Absolute: 0 10*3/uL (ref 0.0–0.1)
Eosinophils Relative: 2 % (ref 0–5)
HCT: 36.6 % (ref 36.0–46.0)
Lymphocytes Relative: 50 % — ABNORMAL HIGH (ref 12–46)
Lymphs Abs: 3.5 10*3/uL (ref 0.7–4.0)
MCV: 92.4 fL (ref 78.0–100.0)
Neutro Abs: 3.1 10*3/uL (ref 1.7–7.7)
Platelets: 250 10*3/uL (ref 150–400)
RBC: 3.96 MIL/uL (ref 3.87–5.11)
WBC: 7.1 10*3/uL (ref 4.0–10.5)

## 2011-12-16 LAB — COMPREHENSIVE METABOLIC PANEL
ALT: 15 U/L (ref 0–35)
AST: 17 U/L (ref 0–37)
Alkaline Phosphatase: 87 U/L (ref 39–117)
CO2: 30 mEq/L (ref 19–32)
Calcium: 9.6 mg/dL (ref 8.4–10.5)
Chloride: 104 mEq/L (ref 96–112)
GFR calc Af Amer: >90 mL/min (ref >90)
GFR calc non Af Amer: >90 mL/min (ref >90)
Glucose, Bld: 80 mg/dL (ref 70–99)
Sodium: 142 mEq/L (ref 135–145)
Total Bilirubin: 0.2 mg/dL — ABNORMAL LOW (ref 0.3–1.2)

## 2011-12-16 MED ORDER — POTASSIUM CHLORIDE CRYS ER 20 MEQ PO TBCR: 20.0000 meq | EXTENDED_RELEASE_TABLET | Freq: Every day | ORAL | Status: DC

## 2011-12-16 NOTE — Progress Notes (Signed)
April Ribas, MD [address] [place] [address]  1. Adenocarcinoma of colon     CURRENT THERAPY: CEA every 3 months  INTERVAL HISTORY: April Wade 56 y.o. 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 Resection margins were clear and she also had associated appendectomy which was negative for pathology. She had resection on 11/21/2010.    Patient reports she had a "flare-up" in May.  She cannot tell me what she means by that but on chart review, April Wade was seen by Dr. Malvin Johns and was noted to have an abdominal wall mass on CT scan.  He removed the abdominal wall mass with repair of the abdominal wall defect with mesh prosthesis on 08/03/2011.  She denies any blood in stool, she denies black tarry stool.  She does admit to thin stool, but denies it being "pencil-thin."  I am not too sure she understood the question completely.  She denies any complaints.  She reports that her appetite is fair and stable.  She is small in size and she reports that she has always been really skinny.  She denies any weight loss.  Her weight appears stable.  She denies any blood loss.  I discussed future follow-up with Alona Bene.  We will perform CEAs every 3 months.  She has not been getting these done in the past.  I will also ascertain CBC diff, and CMET today as well.  I will refer the patient back to Dr. Karilyn Cota for consideration of 1 year screening colonoscopy from date of surgery.    She reports to me that she has a left breast lesion and 2 nodules in her left axilla and one on the superior portion of her back. She reports that she noticed the nodule Further details noted in the physical exam section.   Otherwise, she denies any complaints and complete ROS questioning is negative.   Past Medical  History  Diagnosis Date  . Headache, migraine   . Borderline diabetic   . Skin cancer of face     colon dx 10/2010  . COPD (chronic obstructive pulmonary disease)   . Shortness of breath   . Asthma   . Adenocarcinoma     abdominal wall  . Arthritis   . Dental caries   . 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 Resection margins were clear and she also had associated appendectomy which was negative for pathology. She had resection on 11/21/2010.     has Adenocarcinoma of colon on her problem list.      has no known allergies.  April Wade does not currently have medications on file.  Past Surgical History  Procedure Date  . Skin cancer excision     [facility]  . Colonoscopy 10/29/2010    Procedure: COLONOSCOPY;  Surgeon: Malissa Hippo, MD;  Location: [facility];  Service: Endoscopy;  Laterality: N/A;  . Colostomy revision 11/21/2010    Procedure: COLON RESECTION RIGHT;  Surgeon: Marlane Hatcher;  Location: [facility];  Service: General;  Laterality: Right;  Right Colectomy, Frozen section.  Pathology notified 11/20/2010  . Mass excision 08/03/2011    Procedure: EXCISION MASS;  Surgeon: Marlane Hatcher, MD;  Location: [facility];  Service: General;  Laterality: N/A;  Abdominal Wall Mass with repair of abdominal wall defect with proceed mesh  . Abdominal wall defect repair 08/03/2011    Procedure: REPAIR ABDOMINAL WALL;  Surgeon: Marlane Hatcher, MD;  Location: [facility];  Service: General;  Laterality: N/A;    Denies any headaches, dizziness, double vision, fevers, chills, night sweats, nausea, vomiting, diarrhea, constipation, chest pain, heart palpitations, shortness of breath, blood in stool, black tarry stool, urinary pain, urinary burning, urinary frequency, hematuria.   PHYSICAL EXAMINATION  ECOG PERFORMANCE  STATUS: 1 - Symptomatic but completely ambulatory  Filed Vitals:   12/16/11 1406  BP: 138/66  Pulse: 76  Temp: 98 F (36.7 C)  Resp: 18    GENERAL:alert, no distress, cachectic, comfortable, cooperative, smiling and mentally handicapped SKIN: skin color, texture, turgor are normal, no rashes or significant lesions HEAD: Normocephalic, No masses, lesions, tenderness or abnormalities EYES: normal, Conjunctiva are pink and non-injected EARS: External ears normal OROPHARYNX:lips, buccal mucosa, and tongue normal and mucous membranes are moist  NECK: supple, no adenopathy, thyroid normal size, non-tender, without nodularity, no stridor, non-tender, trachea midline LYMPH:  Small left 5 mm axillary nodule, at posterior line along the teres major/latissimus dorsi musculature.  BREAST:right breast normal without mass, skin or nipple changes or axillary nodes, abnormal mass palpable in left breast at the 11-12 o'clock position 4-6 cm superior to areola measuring approximately 1-2 cm is size.  It is hard to the touch, but mobile.  It is not fixed.  It is non-tender.  No nipple distortion.  No nipple discharge. LUNGS: clear to auscultation and percussion HEART: regular rate & rhythm, no murmurs, no gallops, S1 normal and S2 normal ABDOMEN:abdomen soft, non-tender, obese, normal bowel sounds, no masses or organomegaly, midline abdominal surgical scar (well-healed) and no hepatosplenomegaly BACK: Back symmetric, no curvature., No CVA tenderness. Superior left back small hard nodule measuring 0.5-1 cm in size medial-lateral clavicular line.  Nontender, mobile. EXTREMITIES:less then 2 second capillary refill, no joint deformities, effusion, or inflammation, no edema, no skin discoloration, no clubbing, no cyanosis  NEURO: alert & oriented x 3 with fluent speech, no focal motor/sensory deficits, gait normal   PENDING LABS: CBC diff, CMET, CEA   RADIOGRAPHIC STUDIES:  07/23/2011  *RADIOLOGY REPORT*    Clinical Data: Hernia, history of colon cancer  CT ABDOMEN WITHOUT AND WITH CONTRAST  Technique: Multidetector CT imaging of the abdomen was performed  following the standard protocol before and during bolus  administration of intravenous contrast.  Contrast: OMNIPAQUE IOHEXOL 300 MG/ML SOLN  Comparison: None.  Findings: Sagittal images of the spine shows no destructive bony  lesions. Mild multilevel anterior spurring is noted. There is  disc space flattening with vacuum disc phenomenon at L5-S1 level.  Mild disc bulge noted at L5 S1 level.  Lung bases are unremarkable.  Unenhanced images of the abdomen shows no renal or pancreatic  calcifications. No calcified gallstones are noted within  gallbladder.  Oral contrast was given to the patient. Atherosclerotic  calcifications are noted abdominal aorta and the iliac arteries  without evidence of aneurysm.  Enhanced liver, pancreas, spleen and adrenal glands are  unremarkable. A small hiatal hernia is noted. Enhanced kidneys  are symmetrical in size. No hydronephrosis or hydroureter.  Delayed renal images shows bilateral renal symmetrical excretion.  There is a palpable nodule anterior abdominal wall just right  midline anterior abdominal wall at the prior incision site.  A skin  marker was placed. On the unenhanced images there is a  subcutaneous nodule in this region measures 1.2 cm and 35 HU in  attenuation. On the arterial enhanced images there is no  significant enhancement. On the delayed images there is delayed  enhancement of this nodule which measures 74 HU units in  attenuation. Although may be inflammatory in nature metastatic  disease cannot be excluded. Clinical correlation is necessary.  Further evaluation is recommended. Postsurgical changes are noted  right colon. No intra-abdominal mass or mesenteric fluid  collection is noted.  IMPRESSION:  1. There is a palpable subcutaneous nodule in the right upper   abdominal wall adjacent to midline best seen in the sagittal image  42. Measures about 1.2 cm. There is delayed enhancement of this  nodule. Although may be inflammatory in nature metastatic disease  cannot be excluded. Clinical correlation and further evaluation is  recommended.  2. Degenerative changes lumbar spine.  3. No intra-abdominal mass or fluid collection is noted.  4. No hydronephrosis or hydroureter.  5. Atherosclerotic calcifications abdominal aorta and the iliac  arteries are noted.  Original Report Authenticated By: Natasha Mead, M.D.     PATHOLOGY: 08/03/2011  Diagnosis Soft tissue mass, simple excision, abdominal wall mass - FIBROSIS WITH INFLAMMATION AND FOCAL GIANT CELL REACTION. - NO EVIDENCE OF MALIGNANCY. Microscopic Comment There is skin, subcutaneous tissue and skeletal muscle. There are large areas with fibrosis, consistent with scar, with foci of inflammation and focal giant cell reaction. No malignancy is identified. (JDP:eps 08/04/11) Jimmy Picket MD Pathologist, Electronic Signature (Case signed 08/04/2011)     ASSESSMENT: 1. 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 Resection margins were clear and she also had associated appendectomy which was negative for pathology. She had resection on 11/21/2010.  2. History of mental retardation she states which is why she receives SSI. She has never held a job she states. She went to the ninth grade. She states she never learned to read.  3. History of chronic obstructive pulmonary disease with a pack a day of smoking for 30 years and in the last several years, she is only smoking half a pack a day she states. She gets chronically short- winded when she lays down to sleep at night she states. That is not new but has been going on for several years.  4. Skin cancer surgery in the  left cheek by Dr. Charlton Haws here in [place] she states several years ago.  5. 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 It does need referral. We will set that appointment up with Dr. Charlton Haws.  6. Loss of teeth.  7. Chronic headaches. 8. Left breast nodule with axillary nodule.   PLAN:  1. I personally reviewed and went over laboratory results with the patient. 2. I personally reviewed and went over radiographic studies with the patient. 3. Lab work today: CBC diff, CMET, CEA 4. CEA every 3 months 5. Changing screening mammogram to a diagnostic mammogram.  The date has been changed to 10/16 at 9:45 AM which is the soonest available appointment.  Contact radiography and they will cc the report to [place] in addition to PCP, ordering provider.  6. Referral to Dr. Karilyn Cota for consideration of screening colonoscopy 1 year out from surgery. 7. Return in 1 year for follow-up.  Will remain vigilant  for mammogram results and will see the patient sooner if needed.     All questions were answered. The patient knows to call the clinic with any problems, questions or concerns. We can certainly see the patient much sooner if necessary.   Sande Pickert

## 2011-12-16 NOTE — Addendum Note (Signed)
Addended by: Edythe Lynn A on: 12/16/2011 03:43 PM   Modules accepted: Orders

## 2011-12-16 NOTE — Telephone Encounter (Signed)
Mazie said Dr. Mariel Sleet would like to see if Dr. Karilyn Cota would do a repeat / f/u [facility] on [place]. It has been one year. The return phone number is [phone].

## 2011-12-16 NOTE — Patient Instructions (Addendum)
[facility]  Discharge Instructions  RECOMMENDATIONS MADE BY THE CONSULTANT AND ANY TEST RESULTS WILL BE SENT TO YOUR REFERRING DOCTOR.   EXAM FINDINGS BY MD TODAY AND SIGNS AND SYMPTOMS TO REPORT TO CLINIC OR PRIMARY MD:  Exam per T. Jacalyn Lefevre PA You need to go for your mammogram as scheduled 10/10   INSTRUCTIONS GIVEN AND DISCUSSED: If you do not hear from your mammogram within a few days of having it done please call us.  SPECIAL INSTRUCTIONS/FOLLOW-UP: Referral to Dr. Karilyn Cota for screening colonoscopy Labs today and then labs every 3 months until you come back in a year.   I acknowledge that I have been informed and understand all the instructions given to me and received a copy. I do not have any more questions at this time, but understand that I may call the Specialty Clinic at [facility] at [phone] during business hours should I have any further questions or need assistance in obtaining follow-up care.    __________________________________________  _____________  __________ Signature of Patient or Authorized Representative            Date                   Time    __________________________________________ Nurse's Signature

## 2011-12-16 NOTE — Addendum Note (Signed)
Addended by: Ellouise Newer on: 12/16/2011 05:23 PM   Modules accepted: Level of Service

## 2011-12-23 NOTE — Telephone Encounter (Signed)
Forwarded to Dr.Rehman for review. 

## 2011-12-23 NOTE — Telephone Encounter (Signed)
Patient had right, colectomy for adenocarcinoma of the colon in September 2012. She needs to be scheduled for surveillance colonoscopy.

## 2011-12-24 ENCOUNTER — Ambulatory Visit (HOSPITAL_COMMUNITY): Payer: Medicaid Other

## 2011-12-24 ENCOUNTER — Encounter (INDEPENDENT_AMBULATORY_CARE_PROVIDER_SITE_OTHER): Payer: Self-pay | Admitting: *Deleted

## 2011-12-24 ENCOUNTER — Telehealth (HOSPITAL_COMMUNITY): Payer: Self-pay | Admitting: *Deleted

## 2011-12-24 NOTE — Telephone Encounter (Signed)
Letter mailed to April Wade asking her to call me to schedule TCS

## 2011-12-24 NOTE — Telephone Encounter (Signed)
Noted and forwarded to Dewayne Hatch to arrange

## 2011-12-24 NOTE — Telephone Encounter (Signed)
April Wade was called and made aware.

## 2011-12-30 ENCOUNTER — Other Ambulatory Visit (HOSPITAL_COMMUNITY): Payer: Self-pay | Admitting: Family Medicine

## 2011-12-30 ENCOUNTER — Ambulatory Visit (HOSPITAL_COMMUNITY)
Admission: RE | Admit: 2011-12-30 | Discharge: 2011-12-30 | Disposition: A | Discharge status: Home / Self Care | Payer: Medicaid Other | Source: Ambulatory Visit | Attending: Family Medicine | Admitting: Family Medicine

## 2011-12-30 DIAGNOSIS — N63 Unspecified lump in unspecified breast: Secondary | ICD-10-CM

## 2011-12-30 DIAGNOSIS — Z85038 Personal history of other malignant neoplasm of large intestine: Secondary | ICD-10-CM | POA: Insufficient documentation

## 2012-03-17 ENCOUNTER — Encounter (HOSPITAL_COMMUNITY): Payer: Medicaid Other | Attending: Oncology

## 2012-03-17 DIAGNOSIS — F79 Unspecified intellectual disabilities: Secondary | ICD-10-CM | POA: Insufficient documentation

## 2012-03-17 DIAGNOSIS — C183 Malignant neoplasm of hepatic flexure: Secondary | ICD-10-CM

## 2012-03-17 DIAGNOSIS — C189 Malignant neoplasm of colon, unspecified: Secondary | ICD-10-CM | POA: Insufficient documentation

## 2012-03-17 DIAGNOSIS — J449 Chronic obstructive pulmonary disease, unspecified: Secondary | ICD-10-CM | POA: Insufficient documentation

## 2012-03-17 DIAGNOSIS — J4489 Other specified chronic obstructive pulmonary disease: Secondary | ICD-10-CM | POA: Insufficient documentation

## 2012-03-17 NOTE — Progress Notes (Signed)
Labs drawn today for cea 

## 2012-06-15 ENCOUNTER — Other Ambulatory Visit (HOSPITAL_COMMUNITY): Payer: Medicaid Other

## 2012-06-17 ENCOUNTER — Other Ambulatory Visit (HOSPITAL_COMMUNITY): Payer: Medicaid Other

## 2012-06-21 ENCOUNTER — Other Ambulatory Visit (HOSPITAL_COMMUNITY): Payer: Self-pay | Admitting: Oncology

## 2012-06-21 ENCOUNTER — Encounter (HOSPITAL_COMMUNITY): Payer: Medicaid Other | Attending: Oncology

## 2012-06-21 DIAGNOSIS — J4489 Other specified chronic obstructive pulmonary disease: Secondary | ICD-10-CM | POA: Insufficient documentation

## 2012-06-21 DIAGNOSIS — C189 Malignant neoplasm of colon, unspecified: Secondary | ICD-10-CM | POA: Insufficient documentation

## 2012-06-21 DIAGNOSIS — C183 Malignant neoplasm of hepatic flexure: Secondary | ICD-10-CM

## 2012-06-21 DIAGNOSIS — F79 Unspecified intellectual disabilities: Secondary | ICD-10-CM | POA: Insufficient documentation

## 2012-06-21 DIAGNOSIS — J449 Chronic obstructive pulmonary disease, unspecified: Secondary | ICD-10-CM | POA: Insufficient documentation

## 2012-06-21 NOTE — Progress Notes (Signed)
Labs drawn today for cea 

## 2012-09-14 ENCOUNTER — Encounter (HOSPITAL_COMMUNITY): Payer: Medicaid Other | Attending: Oncology

## 2012-09-14 DIAGNOSIS — J4489 Other specified chronic obstructive pulmonary disease: Secondary | ICD-10-CM | POA: Insufficient documentation

## 2012-09-14 DIAGNOSIS — C189 Malignant neoplasm of colon, unspecified: Secondary | ICD-10-CM | POA: Insufficient documentation

## 2012-09-14 DIAGNOSIS — J449 Chronic obstructive pulmonary disease, unspecified: Secondary | ICD-10-CM | POA: Insufficient documentation

## 2012-09-14 DIAGNOSIS — F79 Unspecified intellectual disabilities: Secondary | ICD-10-CM | POA: Insufficient documentation

## 2012-09-14 DIAGNOSIS — C183 Malignant neoplasm of hepatic flexure: Secondary | ICD-10-CM

## 2012-09-14 NOTE — Progress Notes (Signed)
Labs drawn today for cea 

## 2012-11-28 ENCOUNTER — Other Ambulatory Visit (HOSPITAL_COMMUNITY): Payer: Self-pay | Admitting: Family Medicine

## 2012-11-28 DIAGNOSIS — Z139 Encounter for screening, unspecified: Secondary | ICD-10-CM

## 2012-12-09 ENCOUNTER — Other Ambulatory Visit (HOSPITAL_COMMUNITY): Payer: Self-pay

## 2012-12-09 DIAGNOSIS — C189 Malignant neoplasm of colon, unspecified: Secondary | ICD-10-CM

## 2012-12-13 ENCOUNTER — Ambulatory Visit (HOSPITAL_COMMUNITY)
Admission: RE | Admit: 2012-12-13 | Discharge: 2012-12-13 | Disposition: A | Discharge status: Home / Self Care | Payer: Medicaid Other | Source: Ambulatory Visit | Attending: Family Medicine | Admitting: Family Medicine

## 2012-12-13 ENCOUNTER — Other Ambulatory Visit (HOSPITAL_COMMUNITY): Payer: Self-pay | Admitting: Family Medicine

## 2012-12-13 DIAGNOSIS — J438 Other emphysema: Secondary | ICD-10-CM | POA: Insufficient documentation

## 2012-12-13 DIAGNOSIS — J449 Chronic obstructive pulmonary disease, unspecified: Secondary | ICD-10-CM

## 2012-12-14 ENCOUNTER — Other Ambulatory Visit (HOSPITAL_COMMUNITY): Payer: Medicaid Other

## 2012-12-15 ENCOUNTER — Other Ambulatory Visit (HOSPITAL_COMMUNITY): Payer: Medicaid Other

## 2012-12-16 ENCOUNTER — Other Ambulatory Visit (HOSPITAL_COMMUNITY): Payer: Self-pay | Admitting: Family Medicine

## 2012-12-16 DIAGNOSIS — R0989 Other specified symptoms and signs involving the circulatory and respiratory systems: Secondary | ICD-10-CM

## 2012-12-19 ENCOUNTER — Encounter (INDEPENDENT_AMBULATORY_CARE_PROVIDER_SITE_OTHER): Payer: Self-pay | Admitting: *Deleted

## 2012-12-19 ENCOUNTER — Encounter (HOSPITAL_COMMUNITY): Payer: Self-pay | Admitting: Oncology

## 2012-12-19 ENCOUNTER — Ambulatory Visit (HOSPITAL_COMMUNITY)
Admission: RE | Admit: 2012-12-19 | Discharge: 2012-12-19 | Disposition: A | Discharge status: Home / Self Care | Payer: Medicaid Other | Source: Ambulatory Visit | Attending: Family Medicine | Admitting: Family Medicine

## 2012-12-19 ENCOUNTER — Encounter (HOSPITAL_COMMUNITY): Payer: Medicaid Other | Attending: Oncology | Admitting: Oncology

## 2012-12-19 VITALS — BP 126/67 | HR 74 | Temp 98.4°F | Resp 16 | Wt 118.3 lb

## 2012-12-19 DIAGNOSIS — J449 Chronic obstructive pulmonary disease, unspecified: Secondary | ICD-10-CM | POA: Insufficient documentation

## 2012-12-19 DIAGNOSIS — R51 Headache: Secondary | ICD-10-CM

## 2012-12-19 DIAGNOSIS — R7309 Other abnormal glucose: Secondary | ICD-10-CM | POA: Insufficient documentation

## 2012-12-19 DIAGNOSIS — Z23 Encounter for immunization: Secondary | ICD-10-CM

## 2012-12-19 DIAGNOSIS — I658 Occlusion and stenosis of other precerebral arteries: Secondary | ICD-10-CM | POA: Insufficient documentation

## 2012-12-19 DIAGNOSIS — C189 Malignant neoplasm of colon, unspecified: Secondary | ICD-10-CM | POA: Insufficient documentation

## 2012-12-19 DIAGNOSIS — Z0000 Encounter for general adult medical examination without abnormal findings: Secondary | ICD-10-CM | POA: Insufficient documentation

## 2012-12-19 DIAGNOSIS — F17200 Nicotine dependence, unspecified, uncomplicated: Secondary | ICD-10-CM

## 2012-12-19 DIAGNOSIS — J4489 Other specified chronic obstructive pulmonary disease: Secondary | ICD-10-CM | POA: Insufficient documentation

## 2012-12-19 DIAGNOSIS — I6529 Occlusion and stenosis of unspecified carotid artery: Secondary | ICD-10-CM | POA: Insufficient documentation

## 2012-12-19 DIAGNOSIS — R0989 Other specified symptoms and signs involving the circulatory and respiratory systems: Secondary | ICD-10-CM | POA: Insufficient documentation

## 2012-12-19 LAB — CBC WITH DIFFERENTIAL/PLATELET
Basophils Relative: 0 % (ref 0–1)
Eosinophils Absolute: 0.2 10*3/uL (ref 0.0–0.7)
HCT: 34.1 % — ABNORMAL LOW (ref 36.0–46.0)
Hemoglobin: 11.5 g/dL — ABNORMAL LOW (ref 12.0–15.0)
Lymphs Abs: 3.4 10*3/uL (ref 0.7–4.0)
MCH: 31.4 pg (ref 26.0–34.0)
MCHC: 33.7 g/dL (ref 30.0–36.0)
MCV: 93.2 fL (ref 78.0–100.0)
Monocytes Absolute: 0.4 10*3/uL (ref 0.1–1.0)
Monocytes Relative: 6 % (ref 3–12)
Neutrophils Relative %: 41 % — ABNORMAL LOW (ref 43–77)
RBC: 3.66 MIL/uL — ABNORMAL LOW (ref 3.87–5.11)

## 2012-12-19 LAB — COMPREHENSIVE METABOLIC PANEL
Albumin: 3.7 g/dL (ref 3.5–5.2)
Alkaline Phosphatase: 80 U/L (ref 39–117)
BUN: 11 mg/dL (ref 6–23)
Creatinine, Ser: 0.71 mg/dL (ref 0.50–1.10)
GFR calc Af Amer: >90 mL/min (ref >90)
Glucose, Bld: 82 mg/dL (ref 70–99)
Potassium: 3.7 mEq/L (ref 3.5–5.1)
Total Bilirubin: 0.2 mg/dL — ABNORMAL LOW (ref 0.3–1.2)
Total Protein: 7 g/dL (ref 6.0–8.3)

## 2012-12-19 MED ORDER — INFLUENZA VAC SPLIT QUAD 0.5 ML IM SUSP
0.5000 mL | Freq: Once | INTRAMUSCULAR | Status: AC
  Administered 2012-12-19: 0.5 mL via INTRAMUSCULAR
  Filled 2012-12-19: qty 0.5

## 2012-12-19 NOTE — Progress Notes (Signed)
April Ribas, MD [address] [place] [address]  Adenocarcinoma of colon - Plan: CBC with Differential, Comprehensive metabolic panel, CEA, CEA, CBC with Differential, Comprehensive metabolic panel, CEA, Ambulatory referral to Gastroenterology, CT Abdomen Pelvis W Contrast  Preventative health care - Plan: influenza vac split quadrivalent PF (FLUARIX) injection 0.5 mL  CURRENT THERAPY: Surveillance per NCCN guidelines  INTERVAL HISTORY: April Wade 57 y.o. 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 Resection margins were clear and she also had associated appendectomy which was negative for pathology. She had resection on 11/21/2010.   April Wade is doing well.  She admits that she has not had an influenza vaccine this year yet and therefore we will administer one today.  She reports that she saw her PCP 1 week or so ago and I recommended that she follow-up with Dr. Phillips Odor as directed.    I personally reviewed and went over radiographic studies with the patient.  Her mammogram last year was BIRADS cat 1.  She has another mammogram scheduled for this month on 10/20.   I personally reviewed and went over laboratory results with the patient.  Her CEA is stable and WNL at 3.2.  She has completed 2 years worth of CEA surveillance per NCCN guidelines.  Therefore she has graduated to every 6 month CEA surveillance.   There are a number of things that need to be done from a colon cancer standpoint per NCCN guidelines.  She is overdue for her next screening mammogram.  I will re-refer the patient to Dr. Karilyn Cota for this critical surveillance test.  We will monitor CEA level every 6 months for 3 years ending in Fall 2017 which will complete 5 years worth of CEA surveillance.  She  is also due for an annual CT abd/pelvis for surveillance so we weill also get that set-up.    She admits to some BRBPR but denies any blood in her stool, black tarry stool, changes in bowel frequency, color, and caliber.  She denies any rectal pain.    She continues to smoke 3-6 cigarettes daily.  Smoking cessation education provided.   Otherwise, oncologically, she denies any complaints and ROS questioning is negative.     Past Medical History  Diagnosis Date  . Headache, migraine   . Borderline diabetic   . Skin cancer of face     colon dx 10/2010  . COPD (chronic obstructive pulmonary disease)   . Shortness of breath   . Asthma   . Adenocarcinoma     abdominal wall  . Arthritis   . Dental caries   . 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 Resection margins were clear and she also had associated appendectomy which was negative for pathology. She had resection on 11/21/2010.     has Adenocarcinoma of colon on her problem list.     has No Known Allergies.  April Wade does not currently have medications on file.  Past Surgical History  Procedure Laterality Date  . Skin cancer excision      [facility]  . Colonoscopy  10/29/2010    Procedure: COLONOSCOPY;  Surgeon: Joline Maxcy  Karilyn Cota, MD;  Location: [facility];  Service: Endoscopy;  Laterality: N/A;  . Colostomy revision  11/21/2010    Procedure: COLON RESECTION RIGHT;  Surgeon: Marlane Hatcher;  Location: [facility];  Service: General;  Laterality: Right;  Right Colectomy, Frozen section.  Pathology notified 11/20/2010  . Mass excision  08/03/2011    Procedure: EXCISION MASS;  Surgeon: Marlane Hatcher, MD;  Location: [facility];  Service: General;  Laterality: N/A;  Abdominal Wall Mass with repair of abdominal wall defect with proceed mesh  . Abdominal wall defect  repair  08/03/2011    Procedure: REPAIR ABDOMINAL WALL;  Surgeon: Marlane Hatcher, MD;  Location: [facility];  Service: General;  Laterality: N/A;    Denies any headaches, dizziness, double vision, fevers, chills, night sweats, nausea, vomiting, diarrhea, constipation, chest pain, heart palpitations, shortness of breath, blood in stool, black tarry stool, urinary pain, urinary burning, urinary frequency, hematuria.   PHYSICAL EXAMINATION  ECOG PERFORMANCE STATUS: 0 - Asymptomatic  Filed Vitals:   12/19/12 1300  BP: 126/67  Pulse: 74  Temp: 98.4 F (36.9 C)  Resp: 16    GENERAL:alert, no distress, well nourished, well developed, comfortable, cooperative and smiling, appears chronically ill, appears older than stated age, thickened skin of face. SKIN: skin color, texture, turgor are normal, no rashes or significant lesions HEAD: Normocephalic EYES: normal, PERRLA, EOMI, Conjunctiva are pink and non-injected EARS: External ears normal OROPHARYNX:mucous membranes are moist  NECK: supple, no adenopathy, thyroid normal size, non-tender, without nodularity, no stridor, non-tender, trachea midline LYMPH:  no palpable lymphadenopathy, no hepatosplenomegaly BREAST:not examined LUNGS: clear to auscultation and percussion HEART: regular rate & rhythm, no murmurs, no gallops, S1 normal and S2 normal ABDOMEN:abdomen soft, non-tender, normal bowel sounds, no masses or organomegaly, multiple fatty nodules at midline incision site, right thoracic rib at level #9, and no hepatosplenomegaly BACK: Back symmetric, no curvature., No CVA tenderness EXTREMITIES:less then 2 second capillary refill, no joint deformities, effusion, or inflammation, no edema, no skin discoloration  NEURO: alert & oriented x 3 with fluent speech, no focal motor/sensory deficits, gait normal   LABORATORY DATA: Lab Results  Component Value Date   CEA 3.2 09/14/2012     RADIOGRAPHIC STUDIES:  12/30/2011  *RADIOLOGY  REPORT*  Clinical Data: 57 year old patient with palpable lump in the upper  inner quadrant of the left breast. During the exam in the  ultrasound room she also describes that she palpates a small lump  in the left axilla. Personal history of colon cancer.  DIGITAL DIAGNOSTIC BILATERAL MAMMOGRAM WITH CAD AND LEFT BREAST  ULTRASOUND:  Comparison: 12/22/2010  Findings: The breast parenchyma is extremely dense bilaterally.  Coarse, chunky calcifications consistent with a degenerating  fibroadenoma are seen in the upper left breast in the MLO  projection, stable compared to prior examination. A spot  tangential view of the region of patient concern includes these  coarse calcifications. No suspicious mass, distortion, or  suspicious microcalcification is identified in either breast to  suggest malignancy. No evidence of lymphadenopathy in the axilla.  Mammographic images were processed with CAD.  On physical exam, the patient is very thin. I palpate a 2 cm  circumscribed mobile oval mass 11 o'clock position approximately 3  cm from the left nipple. In the left axilla I palpate a sub-  centimeter superficially positioned nodule, likely an axillary  lymph node.  Ultrasound is performed, showing an oval circumscribed hypoechoic  mass oriented parallel to the chest wall, and containing  several  large calcifications. This is consistent with a degenerating  fibroadenoma and corresponds to the coarse calcifications seen on  mammogram. The mass measures 1.9 x 0.5 x 1.4 cm.  Ultrasound of the left axilla is performed. The the patient is  palpating a 1.0 x 0.4 x 0.5 cm axillary lymph node within normal  fatty hilum and a normal cortex. Survey of the remainder of the  left axilla demonstrates several normal lymph nodes. No  lymphadenopathy.  IMPRESSION:  1. No evidence of malignancy in either breast.  2. The mobile palpable mass 11 o'clock position left breast is a  degenerating fibroadenoma,  stable mammographically. No further  imaging follow-up is recommended.  3. Normal left axillary lymph nodes. The patient can palpate one  of these small lymph nodes, due to her very thin body habitus.  RECOMMENDATION:  Bilateral screening mammogram in 1 year.  BI-RADS CATEGORY 2: Benign finding(s).  Original Report Authenticated By: Britta Mccreedy, M.D.     ASSESSMENT:  1. 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 Resection margins were clear and she also had associated appendectomy which was negative for pathology. She had resection on 11/21/2010.  2. Abdominal wall mass, s/p excision by Dr. Malvin Johns on 08/03/2011, negative for malignancy.  3. History of mental retardation she states which is why she receives SSI. She has never held a job she states. She went to the ninth grade. She states she never learned to read.  4. History of chronic obstructive pulmonary disease with a pack a day of smoking for 30 years and in the last several years, she is only smoking half a pack a day she states. She gets chronically short- winded when she lays down to sleep at night she states. That is not new but has been going on for several years.  5. Skin cancer surgery in the left cheek by Dr. Charlton Haws here in [place] she states several years ago.  6. Loss of teeth.  7. Chronic headaches.  8. Left breast nodule with axillary nodule, negative of mammogram in 2013.  Patient Active Problem List   Diagnosis Date Noted  . Adenocarcinoma of colon 12/16/2011      PLAN:  1. I personally reviewed and went over laboratory results with the patient. 2. I personally reviewed and went over radiographic studies with the patient. 3. Labs today: CBC diff, CMET, CEA 4. CEA every 6 months (fininshing in Fall 2017) 5. CT abd/pelvis with contrast this year for annual surveillance 6.  Screening mammogram is scheduled for 01/02/2013 7. Overdue for Screening Colonoscopy 8. Referral to Dr. Karilyn Cota for screening colonoscopy. 9. Influenza vaccine today 10. Return in 6 months for follow-up   THERAPY PLAN:  With a number of item regarding her cancer surveillance requiring completion, we will see her back in 6 months to verify that they have been completed.  We will continue to follow NCCN guidelines for surveillance: NCCN guidelines for Stage II/III Colon Cancer are as follows:  1. H+P every 3-6 months for 2 years and then every 6 months for a total of 5 years.  2. CEA every 3-6 months for 2 years and then every 6 months for a total of 5 years (completing in fall 2017)  3. Colonoscopy at 1 year and then as indicated (except if no preoperative colonoscopy due to obstructing lesion, colonoscopy in 3-6 months)   A. If  advanced adenoma, then repeat in 1 year   B. If no advanced adenoma, repeat in 3 years, then every 5 years.   4. CT CAP annually for up to 5 years for patients at high risk for recurrence.  5. PET scan is not routinely recommended.    All questions were answered. The patient knows to call the clinic with any problems, questions or concerns. We can certainly see the patient much sooner if necessary.  Patient and plan discussed with Dr. Alla German and he is in agreement with the aforementioned.   More than 50% of the time spent with the patient was utilized for counseling and coordination of care.   Gregory Barrick

## 2012-12-19 NOTE — Patient Instructions (Addendum)
[facility] Discharge Instructions  RECOMMENDATIONS MADE BY THE CONSULTANT AND ANY TEST RESULTS WILL BE SENT TO YOUR REFERRING PHYSICIAN.  EXAM FINDINGS BY THE PHYSICIAN TODAY AND SIGNS OR SYMPTOMS TO REPORT TO CLINIC OR PRIMARY PHYSICIAN: Exam and findings as discussed by Dellis Anes, PA-C.  You are doing well.  Report changes in bowel habits, blood in your bowel movement or other problems.  Will check some blood work today and give you a flu vaccine.  MEDICATIONS PRESCRIBED:  none  INSTRUCTIONS/FOLLOW-UP: GI referral for follow-up colonoscopy CT scans in November, blood work every 6 months and follow-up in 6 months.  Thank you for choosing [facility] to provide your oncology and hematology care.  To afford each patient quality time with our providers, please arrive at least 15 minutes before your scheduled appointment time.  With your help, our goal is to use those 15 minutes to complete the necessary work-up to ensure our physicians have the information they need to help with your evaluation and healthcare recommendations.    Effective January 1st, 2014, we ask that you re-schedule your appointment with our physicians should you arrive 10 or more minutes late for your appointment.  We strive to give you quality time with our providers, and arriving late affects you and other patients whose appointments are after yours.    Again, thank you for choosing [facility].  Our hope is that these requests will decrease the amount of time that you wait before being seen by our physicians.       _____________________________________________________________  Should you have questions after your visit to [facility], please contact our office at [phone] between the hours of 8:30 a.m. and 5:00 p.m.  Voicemails left after 4:30 p.m. will not be returned until the following business day.  For prescription refill requests, have your  pharmacy contact our office with your prescription refill request.

## 2012-12-19 NOTE — Progress Notes (Signed)
April Wade presents today for injection per MD orders. Flu vaccine administered IM in left Deltoid. Administration without incident. Patient tolerated well.  April Wade presented for labwork. Labs per MD order drawn via Peripheral Line 23 gauge needle inserted in left AC  Good blood return present. Procedure without incident.  Needle removed intact. Patient tolerated procedure well.

## 2012-12-20 ENCOUNTER — Ambulatory Visit (HOSPITAL_COMMUNITY): Payer: Medicaid Other

## 2012-12-20 LAB — CEA: CEA: 2.9 ng/mL (ref 0.0–5.0)

## 2012-12-22 ENCOUNTER — Telehealth (INDEPENDENT_AMBULATORY_CARE_PROVIDER_SITE_OTHER): Payer: Self-pay | Admitting: *Deleted

## 2012-12-22 ENCOUNTER — Other Ambulatory Visit (INDEPENDENT_AMBULATORY_CARE_PROVIDER_SITE_OTHER): Payer: Self-pay | Admitting: *Deleted

## 2012-12-22 DIAGNOSIS — Z85038 Personal history of other malignant neoplasm of large intestine: Secondary | ICD-10-CM

## 2012-12-22 DIAGNOSIS — Z1211 Encounter for screening for malignant neoplasm of colon: Secondary | ICD-10-CM

## 2012-12-22 NOTE — Telephone Encounter (Signed)
Patient needs movi prep 

## 2012-12-23 MED ORDER — PEG-KCL-NACL-NASULF-NA ASC-C 100 G PO SOLR: 1.0000 | Freq: Once | ORAL | Status: DC

## 2013-01-02 ENCOUNTER — Ambulatory Visit (HOSPITAL_COMMUNITY)
Admission: RE | Admit: 2013-01-02 | Discharge: 2013-01-02 | Disposition: A | Discharge status: Home / Self Care | Payer: Medicaid Other | Source: Ambulatory Visit | Attending: Family Medicine | Admitting: Family Medicine

## 2013-01-02 DIAGNOSIS — Z139 Encounter for screening, unspecified: Secondary | ICD-10-CM

## 2013-01-02 DIAGNOSIS — Z1231 Encounter for screening mammogram for malignant neoplasm of breast: Secondary | ICD-10-CM | POA: Insufficient documentation

## 2013-01-26 ENCOUNTER — Telehealth (INDEPENDENT_AMBULATORY_CARE_PROVIDER_SITE_OTHER): Payer: Self-pay | Admitting: *Deleted

## 2013-01-26 NOTE — Telephone Encounter (Signed)
agree

## 2013-01-26 NOTE — Telephone Encounter (Signed)
  Procedure: tcs  Reason/Indication:  Colon ca  Has patient had this procedure before?  Yes, 10/2010  If so, when, by whom and where?    Is there a family history of colon cancer?    Who?  What age when diagnosed?    Is patient diabetic?   Borderline      Does patient have prosthetic heart valve?  no  Do you have a pacemaker?  no  Has patient ever had endocarditis? no  Has patient had joint replacement within last 12 months?  no  Does patient tend to be constipated or take laxatives? no  Is patient on Coumadin, Plavix and/or Aspirin? no  Medications: see EPIC  Allergies: nkda  Medication Adjustment:   Procedure date & time: 02/15/13 at 1030

## 2013-01-30 ENCOUNTER — Ambulatory Visit (HOSPITAL_COMMUNITY)
Admission: RE | Admit: 2013-01-30 | Discharge: 2013-01-30 | Disposition: A | Discharge status: Home / Self Care | Payer: Medicaid Other | Source: Ambulatory Visit | Attending: Oncology | Admitting: Oncology

## 2013-01-30 DIAGNOSIS — C189 Malignant neoplasm of colon, unspecified: Secondary | ICD-10-CM

## 2013-01-30 DIAGNOSIS — I700 Atherosclerosis of aorta: Secondary | ICD-10-CM | POA: Insufficient documentation

## 2013-01-30 DIAGNOSIS — K7689 Other specified diseases of liver: Secondary | ICD-10-CM | POA: Insufficient documentation

## 2013-01-30 MED ORDER — SODIUM CHLORIDE 0.9 % IJ SOLN
INTRAMUSCULAR | Status: AC
  Filled 2013-01-30: qty 50

## 2013-01-30 MED ORDER — SODIUM CHLORIDE 0.9 % IJ SOLN
INTRAMUSCULAR | Status: AC
  Filled 2013-01-30: qty 200

## 2013-01-30 MED ORDER — IOHEXOL 300 MG/ML  SOLN
100.0000 mL | Freq: Once | INTRAMUSCULAR | Status: AC | PRN
  Administered 2013-01-30: 100 mL via INTRAVENOUS

## 2013-01-31 ENCOUNTER — Encounter (HOSPITAL_COMMUNITY): Payer: Self-pay

## 2013-02-15 ENCOUNTER — Encounter (HOSPITAL_COMMUNITY): Payer: Self-pay | Admitting: *Deleted

## 2013-02-15 ENCOUNTER — Ambulatory Visit (HOSPITAL_COMMUNITY)
Admission: RE | Admit: 2013-02-15 | Discharge: 2013-02-15 | Disposition: A | Discharge status: Home / Self Care | Payer: Medicaid Other | Source: Ambulatory Visit | Attending: Internal Medicine | Admitting: Internal Medicine

## 2013-02-15 ENCOUNTER — Encounter (HOSPITAL_COMMUNITY)
Admission: RE | Disposition: A | Discharge status: Home / Self Care | Payer: Self-pay | Source: Ambulatory Visit | Attending: Internal Medicine

## 2013-02-15 DIAGNOSIS — D126 Benign neoplasm of colon, unspecified: Secondary | ICD-10-CM

## 2013-02-15 DIAGNOSIS — J449 Chronic obstructive pulmonary disease, unspecified: Secondary | ICD-10-CM | POA: Insufficient documentation

## 2013-02-15 DIAGNOSIS — Z9049 Acquired absence of other specified parts of digestive tract: Secondary | ICD-10-CM | POA: Insufficient documentation

## 2013-02-15 DIAGNOSIS — R7309 Other abnormal glucose: Secondary | ICD-10-CM | POA: Insufficient documentation

## 2013-02-15 DIAGNOSIS — D129 Benign neoplasm of anus and anal canal: Secondary | ICD-10-CM

## 2013-02-15 DIAGNOSIS — Q438 Other specified congenital malformations of intestine: Secondary | ICD-10-CM

## 2013-02-15 DIAGNOSIS — Z85038 Personal history of other malignant neoplasm of large intestine: Secondary | ICD-10-CM | POA: Insufficient documentation

## 2013-02-15 DIAGNOSIS — J4489 Other specified chronic obstructive pulmonary disease: Secondary | ICD-10-CM | POA: Insufficient documentation

## 2013-02-15 DIAGNOSIS — D128 Benign neoplasm of rectum: Secondary | ICD-10-CM

## 2013-02-15 HISTORY — PX: COLONOSCOPY: SHX5424

## 2013-02-15 SURGERY — COLONOSCOPY
Anesthesia: Moderate Sedation

## 2013-02-15 MED ORDER — SODIUM CHLORIDE 0.9 % IV SOLN
INTRAVENOUS | Status: DC
  Administered 2013-02-15: 1000 mL via INTRAVENOUS

## 2013-02-15 MED ORDER — MIDAZOLAM HCL 5 MG/5ML IJ SOLN
INTRAMUSCULAR | Status: AC
  Filled 2013-02-15: qty 10

## 2013-02-15 MED ORDER — MIDAZOLAM HCL 5 MG/5ML IJ SOLN
INTRAMUSCULAR | Status: DC | PRN
  Administered 2013-02-15 (×4): 1 mg via INTRAVENOUS
  Administered 2013-02-15 (×2): 2 mg via INTRAVENOUS

## 2013-02-15 MED ORDER — STERILE WATER FOR IRRIGATION IR SOLN
Status: DC | PRN
  Administered 2013-02-15: 11:00:00

## 2013-02-15 MED ORDER — MEPERIDINE HCL 50 MG/ML IJ SOLN
INTRAMUSCULAR | Status: DC | PRN
  Administered 2013-02-15 (×2): 25 mg via INTRAVENOUS

## 2013-02-15 MED ORDER — MEPERIDINE HCL 50 MG/ML IJ SOLN
INTRAMUSCULAR | Status: AC
  Filled 2013-02-15: qty 1

## 2013-02-15 NOTE — H&P (Addendum)
April Wade is an 57 y.o. female.   Chief Complaint: Patient is here for colonoscopy. HPI: Patient is 57 year old Caucasian female who underwent average risk screening colonoscopy in August 2012 and had multiple adenomas removed along with large flat lesion from the hepatic flexure which had invasive carcinoma leading to surgery. She had a right hemicolectomy in September 2012 by Dr. Barbaraann Barthel in with stage IIa disease. 15 out of 15 lymph nodes were negative for metastatic disease. She's been followed at oncology clinic and remains in remission. She says she is appetite. She denies recent weight loss. She denies rectal bleeding or melena but she did notice small amount of blood per rectum this morning. Family history is negative for CRC.  Past Medical History  Diagnosis Date  . Headache, migraine   . Borderline diabetic   . Skin cancer of face     colon dx 10/2010  . COPD (chronic obstructive pulmonary disease)   . Shortness of breath   . Asthma   . Adenocarcinoma     abdominal wall  . Arthritis   . Dental caries   . 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 Resection margins were clear and she also had associated appendectomy which was negative for pathology. She had resection on 11/21/2010.     Past Surgical History  Procedure Laterality Date  . Skin cancer excision      [facility]  . Colonoscopy  10/29/2010    Procedure: COLONOSCOPY;  Surgeon: Malissa Hippo, MD;  Location[facility];  Service: Endoscopy;  Laterality: N/A;  . Colostomy revision  11/21/2010    Procedure: COLON RESECTION RIGHT;  Surgeon: Marlane Hatcher;  Location: [facility];  Service: General;  Laterality: Right;  Right Colectomy, Frozen section.  Pathology notified 11/20/2010  . Mass excision  08/03/2011    Procedure: EXCISION MASS;   Surgeon: Marlane Hatcher, MD;  Location: [facility];  Service: General;  Laterality: N/A;  Abdominal Wall Mass with repair of abdominal wall defect with proceed mesh  . Abdominal wall defect repair  08/03/2011    Procedure: REPAIR ABDOMINAL WALL;  Surgeon: Marlane Hatcher, MD;  Location: [facility];  Service: General;  Laterality: N/A;    Family History  Problem Relation Age of Onset  . Anesthesia problems Neg Hx   . Hypotension Neg Hx   . Malignant hyperthermia Neg Hx   . Pseudochol deficiency Neg Hx   . Hypertension Mother   . Diabetes Father    Social History:  reports that she has been smoking Cigarettes.  She has a 20 pack-year smoking history. She does not have any smokeless tobacco history on file. She reports that she does not drink alcohol or use illicit drugs.  Allergies: No Known Allergies  Medications Prior to Admission  Medication Sig Dispense Refill  . Cholecalciferol (VITAMIN D) 2000 UNITS tablet Take 2,000 Units by mouth daily.        . cycloSPORINE (RESTASIS) 0.05 % ophthalmic emulsion Place 1 drop into both eyes 2 (two) times daily.       . fish oil-omega-3 fatty acids 1000 MG capsule Take 1 g by mouth daily.       . meloxicam (MOBIC) 15 MG tablet Take 15 mg by mouth daily.      . peg 3350 powder (MOVIPREP) 100 G SOLR Take  1 kit (200 g total) by mouth once.  1 kit  0  . potassium chloride SA (K-DUR,KLOR-CON) 20 MEQ tablet Take 1 tablet (20 mEq total) by mouth daily.  30 tablet  1    No results found for this or any previous visit (from the past 48 hour(s)). No results found.  ROS  Blood pressure 106/54, pulse 53, temperature 98.3 F (36.8 C), temperature source Oral, resp. rate 11, height 5\' 6"  (1.676 m), weight 117 lb (53.071 kg), SpO2 96.00%. Physical Exam  Constitutional:  Well-developed thin Caucasian female in NAD.  HENT:  Mouth/Throat: Oropharynx is clear and moist.  Eyes: Conjunctivae are normal. No scleral icterus.  Neck: No thyromegaly present.   Cardiovascular: Normal rate, regular rhythm and normal heart sounds.   No murmur heard. Respiratory: Effort normal and breath sounds normal.  GI:  Abdomen is flat with midline scar. Soft and nontender without organomegaly or masses.  Musculoskeletal: She exhibits no edema.  Lymphadenopathy:    She has no cervical adenopathy.  Neurological: She is alert.  Skin: Skin is warm and dry.     Assessment/Plan History of colon carcinoma. Surveillance colonoscopy.  REHMAN,NAJEEB U 02/15/2013, 11:18 AM

## 2013-02-15 NOTE — Op Note (Signed)
COLONOSCOPY PROCEDURE REPORT  PATIENT:  April Wade  MR#:  [id number] Birthdate:  01-24-1956, 57 y.o., female Endoscopist:  Dr. Malissa Hippo, MD Referred By:  Dr. Colette Ribas, MD Procedure Date: 02/15/2013  Procedure:   Colonoscopy with snare polypectomy with application of instinct hemoclips the polypectomy site.  Indications:  Patient is 57 year old Caucasian female with history of colon carcinoma. She is status post right hemicolectomy for stage IIa disease in September 2012.  Informed Consent:  The procedure and risks were reviewed with the patient and informed consent was obtained.  Medications:  Demerol 50 mg IV Versed 8 mg IV  Description of procedure:  After a digital rectal exam was performed, that colonoscope was advanced from the anus through the rectum into sigmoid colon and further. Redundant sigmoid colon. Scope passed to the region of hepatic flexure but unable to see the anastomosis. Pediatric colonoscope was exchanged with slim school but without success. As the scope was withdrawn the mucosa was carefully examined and findings noted. While in the rectum scope was retroflexed to examine the anorectal junction.  Findings:   Prep satisfactory. Redundant and none is compliant sigmoid colon resulting in inability to reach ileocolonic anastomosis. Two small polyps ablated via cold biopsy and submitted together. These are located at transverse and sigmoid colon.  35mm complex broad-based polyp snared piecemeal from rectosigmoid junction. Three hemoclips applied to polypectomy site. Polypectomy felt to be complete. Rectal mucosa and anal rectal junction unremarkable.   Therapeutic/Diagnostic Maneuvers Performed:  See above  Complications:  None  Cecal Withdrawal Time: NA  Impression:  Examination performed hepatic flexure but ileocolonic anastomosis not reached. Very tortuous sigmoid colon with inability to prevent or reduce the loop formation most likely  secondary to adhesions. 2 small polyps ablated via cold biopsy and submitted together. Large broad-based polyp snared piecemeal from rectosigmoid junction and 3 instinct clips applied to polypectomy site.   Recommendations:  Standard instructions given. No aspirin or NSAIDs for 1 week. Patient informed that she cannot have an MRI until hemoclips have passed. I will contact patient with biopsy results and further recommendations.  April Wade  02/15/2013 12:57 PM  CC: Dr. Phillips Odor, Chancy Hurter, MD & Dr. Bonnetta Barry ref. provider found CC: Dellis Anes, [facility]

## 2013-02-20 ENCOUNTER — Encounter (INDEPENDENT_AMBULATORY_CARE_PROVIDER_SITE_OTHER): Payer: Self-pay | Admitting: *Deleted

## 2013-02-21 ENCOUNTER — Encounter (HOSPITAL_COMMUNITY): Payer: Self-pay | Admitting: Internal Medicine

## 2013-03-16 HISTORY — PX: SKIN CANCER EXCISION: SHX779

## 2013-06-19 ENCOUNTER — Encounter (HOSPITAL_COMMUNITY): Payer: Medicaid Other | Attending: Hematology and Oncology

## 2013-06-19 DIAGNOSIS — D126 Benign neoplasm of colon, unspecified: Secondary | ICD-10-CM | POA: Insufficient documentation

## 2013-06-19 DIAGNOSIS — J4489 Other specified chronic obstructive pulmonary disease: Secondary | ICD-10-CM | POA: Insufficient documentation

## 2013-06-19 DIAGNOSIS — C189 Malignant neoplasm of colon, unspecified: Secondary | ICD-10-CM

## 2013-06-19 DIAGNOSIS — C183 Malignant neoplasm of hepatic flexure: Secondary | ICD-10-CM

## 2013-06-19 DIAGNOSIS — Z85038 Personal history of other malignant neoplasm of large intestine: Secondary | ICD-10-CM | POA: Insufficient documentation

## 2013-06-19 DIAGNOSIS — Z9049 Acquired absence of other specified parts of digestive tract: Secondary | ICD-10-CM | POA: Insufficient documentation

## 2013-06-19 DIAGNOSIS — Z85828 Personal history of other malignant neoplasm of skin: Secondary | ICD-10-CM | POA: Insufficient documentation

## 2013-06-19 DIAGNOSIS — J449 Chronic obstructive pulmonary disease, unspecified: Secondary | ICD-10-CM | POA: Insufficient documentation

## 2013-06-19 NOTE — Progress Notes (Signed)
Labs drawn today for cea

## 2013-06-20 LAB — CEA: CEA: 3.4 ng/mL (ref 0.0–5.0)

## 2013-06-21 ENCOUNTER — Encounter (HOSPITAL_BASED_OUTPATIENT_CLINIC_OR_DEPARTMENT_OTHER): Payer: Medicaid Other

## 2013-06-21 ENCOUNTER — Encounter (HOSPITAL_COMMUNITY): Payer: Self-pay

## 2013-06-21 VITALS — BP 114/68 | HR 74 | Resp 18 | Wt 119.8 lb

## 2013-06-21 DIAGNOSIS — C44319 Basal cell carcinoma of skin of other parts of face: Secondary | ICD-10-CM

## 2013-06-21 DIAGNOSIS — J449 Chronic obstructive pulmonary disease, unspecified: Secondary | ICD-10-CM

## 2013-06-21 DIAGNOSIS — C189 Malignant neoplasm of colon, unspecified: Secondary | ICD-10-CM

## 2013-06-21 DIAGNOSIS — C182 Malignant neoplasm of ascending colon: Secondary | ICD-10-CM

## 2013-06-21 NOTE — Patient Instructions (Signed)
[place] Discharge Instructions  RECOMMENDATIONS MADE BY THE CONSULTANT AND ANY TEST RESULTS WILL BE SENT TO YOUR REFERRING PHYSICIAN.  We will see you in 4 months for lab work. (CEA and CBC) We will see you in December after your repeat lab work and CT scans.   Thank you for choosing [facility] to provide your oncology and hematology care.  To afford each patient quality time with our providers, please arrive at least 15 minutes before your scheduled appointment time.  With your help, our goal is to use those 15 minutes to complete the necessary work-up to ensure our physicians have the information they need to help with your evaluation and healthcare recommendations.    Effective January 1st, 2014, we ask that you re-schedule your appointment with our physicians should you arrive 10 or more minutes late for your appointment.  We strive to give you quality time with our providers, and arriving late affects you and other patients whose appointments are after yours.    Again, thank you for choosing [facility].  Our hope is that these requests will decrease the amount of time that you wait before being seen by our physicians.       _____________________________________________________________  Should you have questions after your visit to [facility], please contact our office at [phone] between the hours of 8:30 a.m. and 5:00 p.m.  Voicemails left after 4:30 p.m. will not be returned until the following business day.  For prescription refill requests, have your pharmacy contact our office with your prescription refill request.

## 2013-06-21 NOTE — Progress Notes (Signed)
[place]  OFFICE PROGRESS NOTE  April Kilts, MD [address] [place] [address]  DIAGNOSIS: No diagnosis found.  Chief Complaint  Patient presents with  . Colon Cancer    CURRENT THERAPY: Watchful expectation.  INTERVAL HISTORY: April Wade 58 y.o. female returns for followup of stage II colon cancer, status post right hemicolectomy with postop hernia requiring mesh performed on 08/03/2011, no postop treatment with original surgery on 11/21/2010.  She had a basal cell carcinoma removed from the tip of her nose with no postop complications and no need for a skin graft. She also underwent colonoscopy in December of 2014 at which time polyps were found which will be re\re examined again to make sure that they were completely excised by repeating colonoscopy in June of 2015. She denies any nausea, vomiting, diarrhea, constipation, melena, hematochezia, hematuria, fever, night sweats, cough, wheezing, sore throat, earache, lower extremity swelling or redness, skin rash, headache, or seizures.  MEDICAL HISTORY: Past Medical History  Diagnosis Date  . Headache, migraine   . Borderline diabetic   . Skin cancer of face     colon dx 10/2010  . COPD (chronic obstructive pulmonary disease)   . Shortness of breath   . Asthma   . Adenocarcinoma     abdominal wall  . Arthritis   . Dental caries   . 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 Resection margins were clear and she also had associated appendectomy which was negative for pathology. She had resection on 11/21/2010.     INTERIM HISTORY: has Adenocarcinoma of colon on her problem list.   Stage IIA moderately differentiated adenocarcinoma of the colon extending into pericolonic  fatty tissue and muscularis propria but no evidence for LVI; 15 nodes were all negative. Resection margins were clear and she also had associated appendectomy which was negative for pathology. She had resection on 11/21/2010.     ALLERGIES:  has No Known Allergies.  MEDICATIONS: has a current medication list which includes the following prescription(s): vitamin d, cyclosporine, fish oil-omega-3 fatty acids, and potassium chloride sa.  SURGICAL HISTORY:  Past Surgical History  Procedure Laterality Date  . Skin cancer excision      [place]  . Colonoscopy  10/29/2010    Procedure: COLONOSCOPY;  Surgeon: April Houston, MD;  Location: [facility];  Service: Endoscopy;  Laterality: N/A;  . Colostomy revision  11/21/2010    Procedure: COLON RESECTION RIGHT;  Surgeon: April Wade;  Location: [facility] ORS;  Service: General;  Laterality: Right;  Right Colectomy, Frozen section.  Pathology notified 11/20/2010  . Mass excision  08/03/2011    Procedure: EXCISION MASS;  Surgeon: April Ran, MD;  Location: [facility] ORS;  Service: General;  Laterality: N/A;  Abdominal Wall Mass with repair of abdominal wall defect with proceed mesh  . Abdominal wall defect repair  08/03/2011    Procedure: REPAIR ABDOMINAL WALL;  Surgeon: April Ran, MD;  Location: [facility] ORS;  Service: General;  Laterality: N/A;  . Colonoscopy N/A 02/15/2013    Procedure: COLONOSCOPY;  Surgeon: April Houston, MD;  Location: [facility];  Service: Endoscopy;  Laterality: N/A;  1030-moved to 1120 Ann to notify pt    FAMILY HISTORY: family history includes  Diabetes in her father; Hypertension in her mother. There is no history of Anesthesia problems, Hypotension, Malignant hyperthermia, or Pseudochol deficiency.  SOCIAL HISTORY:  reports that she has been smoking Cigarettes.  She has a 20 pack-year smoking history. She does not have any smokeless tobacco history on file. She reports that she does not drink alcohol or use illicit  drugs.  REVIEW OF SYSTEMS:  Other than that discussed above is noncontributory.  PHYSICAL EXAMINATION: ECOG PERFORMANCE STATUS: 0 - Asymptomatic  There were no vitals taken for this visit.  GENERAL:alert, no distress and comfortable SKIN: skin color, texture, turgor are normal, no rashes or significant lesions EYES: PERLA; Conjunctiva are pink and non-injected, sclera clear NOSE: Evidence of recent surgery with no evidence of satellite lesions. SINUSES: No redness or tenderness over maxillary or ethmoid sinuses OROPHARYNX:no exudate, no erythema on lips, buccal mucosa, or tongue. NECK: supple, thyroid normal size, non-tender, without nodularity. No masses CHEST: Increased AP diameter with no breast masses. LYMPH:  no palpable lymphadenopathy in the cervical, axillary or inguinal LUNGS: clear to auscultation and percussion with normal breathing effort HEART: regular rate & rhythm and no murmurs. ABDOMEN:abdomen soft, non-tender and normal bowel sounds. Soft with no organomegaly, ascites, or CVA tenderness. MUSCULOSKELETAL:no cyanosis of digits and no clubbing. Range of motion normal.  NEURO: alert & oriented x 3 with fluent speech, no focal motor/sensory deficits   LABORATORY DATA: Infusion on 06/19/2013  Component Date Value Ref Range Status  . CEA 06/19/2013 3.4  0.0 - 5.0 ng/mL Final   Performed at [place]: No new pathology. Basal cell carcinoma was done in a private physicians office in [facility].  FINAL for April Wade, April Wade ([id number]) Patient: April Wade, April Wade Collected: 02/15/2013 Client: [facility] Accession: [id number] Received: 02/15/2013 April Wade DOB: [birth year] Age: 64 Gender: F Reported: 02/16/2013 [address] Patient Ph: [phone] MRN #: [id number] April Wade [place] [address] Visit #: [id number] Chart #: Phone: [phone] Fax: CC: REPORT OF SURGICAL PATHOLOGY FINAL DIAGNOSIS Diagnosis 1. Colon, polyp(s),  proximal transverse and sigmoid - TUBULAR ADENOMA AND HYPERPLASTIC POLYP. NO HIGH GRADE DYSPLASIA OR MALIGNANCY IDENTIFIED. 2. Rectosigmoid , polyp with large base - FRAGMENTS OF TUBULOVILLOUS ADENOMA. NO HIGH GRADE DYSPLASIA OR MALIGNANCY IDENTIFIED. April Laws MD Pathologist, Electronic Signature (Case signed 02/16/2013) Specimen Gross and Clinical Information Specimen(s) Obtained: 1. Colon, polyp(s), proximal transverse and sigmoid 2. Rectosigmoid , polyp with large base Specimen Clinical Information 2. hx colon ca; s/p right hemicolectomy Gross 1. Received in formalin are polypoid tan, soft tissue fragments that are submitted in toto. Number: two Size: 0.2 cm each. 2. The specimen is received in formalin and consists of a 2.4 x 2.2 x 1.2 cm aggregate of tan-pink polypoid tissue fragments. Multiple pieces display white cauterized proximal resection margins. The proximal resection margins are inked, and the larger fragments are bisected. The specimen is entirely submitted in five cassettes. (KL:ecj 02/15/2013) Report signed out from the following location(s) Technical Component performed at [place], [place] [address] CLIA: [id number]., Interpretation performed at [place], [place], [place] [address]. CLIA #: [id number],  Urinalysis No results found for this basename: colorurine, appearanceur, labspec, phurine, glucoseu, hgbur, bilirubinur, ketonesur, proteinur, urobilinogen, nitrite, leukocytesur    RADIOGRAPHIC STUDIES: MM Digital Screening Status: Final result            Study Result    CLINICAL DATA: Screening.  EXAM:  DIGITAL SCREENING BILATERAL MAMMOGRAM  WITH CAD  COMPARISON: Previous exam(s).  ACR Breast Density Category d: The breasts are extremely dense,  which lowers the sensitivity of mammography.  FINDINGS:  There are no findings suspicious for malignancy. Images were  processed with  CAD.  IMPRESSION:  No mammographic evidence of malignancy. A result letter of this  screening mammogram will be mailed directly to the patient.  RECOMMENDATION:  Screening mammogram in one year. (Code:SM-B-01Y)  BI-RADS CATEGORY 1: Negative  Electronically Signed  By: Lovey Newcomer M.D.  On: 10/24     ASSESSMENT:  #1. Stage II A. moderately differentiated adenocarcinoma of the right colon with no evidence of LV I., 15 lymph nodes negative, stage IIA, status post resection on 11/21/2010 with no postoperative therapy, associated appendectomy was performed concurrently. #2. Status post resection of abdominal hernia with mesh, good surgical result with no evidence of repeat herniation. #3. Chronic obstructive pulmonary disease. #4. Basal cell carcinoma of the nose, status post resection with excellent cosmetic result.   PLAN:  #1. Continue watchful expectation. #2. Repeat colonoscopy in June 2015. #3. Repeat CEA in 4 months. #4. Office visit with CBC, chem profile, CEA, and repeat CT scans in December of 2015.   All questions were answered. The patient knows to call the clinic with any problems, questions or concerns. We can certainly see the patient much sooner if necessary.   I spent 25 minutes counseling the patient face to face. The total time spent in the appointment was 30 minutes.    Farrel Gobble, MD 06/21/2013 1:01 PM

## 2013-08-17 ENCOUNTER — Encounter (INDEPENDENT_AMBULATORY_CARE_PROVIDER_SITE_OTHER): Payer: Self-pay | Admitting: *Deleted

## 2013-08-24 ENCOUNTER — Telehealth (INDEPENDENT_AMBULATORY_CARE_PROVIDER_SITE_OTHER): Payer: Self-pay | Admitting: *Deleted

## 2013-08-24 ENCOUNTER — Other Ambulatory Visit (INDEPENDENT_AMBULATORY_CARE_PROVIDER_SITE_OTHER): Payer: Self-pay | Admitting: *Deleted

## 2013-08-24 DIAGNOSIS — Z1211 Encounter for screening for malignant neoplasm of colon: Secondary | ICD-10-CM

## 2013-08-24 DIAGNOSIS — Z85038 Personal history of other malignant neoplasm of large intestine: Secondary | ICD-10-CM

## 2013-08-24 DIAGNOSIS — Z86010 Personal history of colonic polyps: Secondary | ICD-10-CM

## 2013-08-24 MED ORDER — PEG 3350-KCL-NA BICARB-NACL 420 G PO SOLR: 4000.0000 mL | Freq: Once | ORAL | Status: DC

## 2013-08-24 NOTE — Telephone Encounter (Signed)
Patient needs trilyte 

## 2013-09-01 ENCOUNTER — Telehealth (INDEPENDENT_AMBULATORY_CARE_PROVIDER_SITE_OTHER): Payer: Self-pay | Admitting: *Deleted

## 2013-09-01 NOTE — Telephone Encounter (Signed)
  Procedure: tcs  Reason/Indication:  Colon ca  Has patient had this procedure before?  yes  If so, when, by whom and where?    Is there a family history of colon cancer?    Who?  What age when diagnosed?    Is patient diabetic?   borderline      Does patient have prosthetic heart valve?  no  Do you have a pacemaker?  no  Has patient ever had endocarditis? no  Has patient had joint replacement within last 12 months?  no  Does patient tend to be constipated or take laxatives? no  Is patient on Coumadin, Plavix and/or Aspirin? no  Medications: see EPIC  Allergies: nkda  Medication Adjustment:   Procedure date & time: 09/27/13 at 730

## 2013-09-04 NOTE — Telephone Encounter (Signed)
agree

## 2013-09-13 ENCOUNTER — Encounter (HOSPITAL_COMMUNITY): Payer: Self-pay | Admitting: Pharmacy Technician

## 2013-09-18 ENCOUNTER — Other Ambulatory Visit (HOSPITAL_COMMUNITY): Payer: Self-pay

## 2013-09-18 DIAGNOSIS — C189 Malignant neoplasm of colon, unspecified: Secondary | ICD-10-CM

## 2013-09-20 ENCOUNTER — Other Ambulatory Visit (HOSPITAL_COMMUNITY): Payer: Self-pay | Admitting: Oncology

## 2013-09-20 ENCOUNTER — Encounter (HOSPITAL_COMMUNITY): Payer: Medicaid Other | Attending: Hematology and Oncology

## 2013-09-20 DIAGNOSIS — C182 Malignant neoplasm of ascending colon: Secondary | ICD-10-CM

## 2013-09-20 DIAGNOSIS — C189 Malignant neoplasm of colon, unspecified: Secondary | ICD-10-CM

## 2013-09-20 LAB — CBC WITH DIFFERENTIAL/PLATELET
BASOS PCT: 0 % (ref 0–1)
Basophils Absolute: 0 10*3/uL (ref 0.0–0.1)
Eosinophils Absolute: 0.2 10*3/uL (ref 0.0–0.7)
Eosinophils Relative: 3 % (ref 0–5)
HCT: 35.8 % — ABNORMAL LOW (ref 36.0–46.0)
Hemoglobin: 11.9 g/dL — ABNORMAL LOW (ref 12.0–15.0)
Lymphocytes Relative: 45 % (ref 12–46)
Lymphs Abs: 3.3 10*3/uL (ref 0.7–4.0)
MCH: 30.4 pg (ref 26.0–34.0)
MCHC: 33.2 g/dL (ref 30.0–36.0)
MCV: 91.3 fL (ref 78.0–100.0)
MONOS PCT: 7 % (ref 3–12)
Monocytes Absolute: 0.5 10*3/uL (ref 0.1–1.0)
NEUTROS ABS: 3.3 10*3/uL (ref 1.7–7.7)
Neutrophils Relative %: 45 % (ref 43–77)
Platelets: 262 10*3/uL (ref 150–400)
RBC: 3.92 MIL/uL (ref 3.87–5.11)
RDW: 14.7 % (ref 11.5–15.5)
WBC: 7.3 10*3/uL (ref 4.0–10.5)

## 2013-09-20 LAB — COMPREHENSIVE METABOLIC PANEL
ALBUMIN: 3.8 g/dL (ref 3.5–5.2)
ALK PHOS: 90 U/L (ref 39–117)
ALT: 14 U/L (ref 0–35)
AST: 17 U/L (ref 0–37)
Anion gap: 10 (ref 5–15)
BILIRUBIN TOTAL: 0.2 mg/dL — AB (ref 0.3–1.2)
BUN: 8 mg/dL (ref 6–23)
CHLORIDE: 105 meq/L (ref 96–112)
CO2: 30 meq/L (ref 19–32)
Calcium: 8.9 mg/dL (ref 8.4–10.5)
Creatinine, Ser: 0.78 mg/dL (ref 0.50–1.10)
GFR calc Af Amer: >90 mL/min (ref >90)
GFR calc non Af Amer: 90 mL/min — ABNORMAL LOW (ref >90)
Glucose, Bld: 82 mg/dL (ref 70–99)
POTASSIUM: 3.4 meq/L — AB (ref 3.7–5.3)
Sodium: 145 mEq/L (ref 137–147)
Total Protein: 7.1 g/dL (ref 6.0–8.3)

## 2013-09-20 LAB — CEA: CEA: 3.8 ng/mL (ref 0.0–5.0)

## 2013-09-20 NOTE — Progress Notes (Signed)
LABS DRAWN FOR CBCD,CMP,CEA

## 2013-09-21 ENCOUNTER — Other Ambulatory Visit (HOSPITAL_COMMUNITY): Payer: Self-pay | Admitting: Oncology

## 2013-09-21 DIAGNOSIS — E876 Hypokalemia: Secondary | ICD-10-CM

## 2013-09-21 MED ORDER — POTASSIUM CHLORIDE CRYS ER 20 MEQ PO TBCR: 20.0000 meq | EXTENDED_RELEASE_TABLET | Freq: Every day | ORAL | Status: DC

## 2013-09-27 ENCOUNTER — Encounter (HOSPITAL_COMMUNITY)
Admission: RE | Disposition: A | Discharge status: Home / Self Care | Payer: Self-pay | Source: Ambulatory Visit | Attending: Internal Medicine

## 2013-09-27 ENCOUNTER — Ambulatory Visit (HOSPITAL_COMMUNITY): Payer: Medicaid Other

## 2013-09-27 ENCOUNTER — Encounter (HOSPITAL_COMMUNITY): Payer: Self-pay | Admitting: *Deleted

## 2013-09-27 ENCOUNTER — Ambulatory Visit (HOSPITAL_COMMUNITY)
Admission: RE | Admit: 2013-09-27 | Discharge: 2013-09-27 | Disposition: A | Discharge status: Home / Self Care | Payer: Medicaid Other | Source: Ambulatory Visit | Attending: Internal Medicine | Admitting: Internal Medicine

## 2013-09-27 DIAGNOSIS — D129 Benign neoplasm of anus and anal canal: Secondary | ICD-10-CM

## 2013-09-27 DIAGNOSIS — Z85038 Personal history of other malignant neoplasm of large intestine: Secondary | ICD-10-CM | POA: Insufficient documentation

## 2013-09-27 DIAGNOSIS — Z8371 Family history of colonic polyps: Secondary | ICD-10-CM | POA: Insufficient documentation

## 2013-09-27 DIAGNOSIS — Q438 Other specified congenital malformations of intestine: Secondary | ICD-10-CM | POA: Insufficient documentation

## 2013-09-27 DIAGNOSIS — Z83719 Family history of colon polyps, unspecified: Secondary | ICD-10-CM | POA: Insufficient documentation

## 2013-09-27 DIAGNOSIS — D126 Benign neoplasm of colon, unspecified: Secondary | ICD-10-CM | POA: Insufficient documentation

## 2013-09-27 DIAGNOSIS — Z860100 Personal history of colon polyps, unspecified: Secondary | ICD-10-CM | POA: Insufficient documentation

## 2013-09-27 DIAGNOSIS — J4489 Other specified chronic obstructive pulmonary disease: Secondary | ICD-10-CM | POA: Insufficient documentation

## 2013-09-27 DIAGNOSIS — Z8249 Family history of ischemic heart disease and other diseases of the circulatory system: Secondary | ICD-10-CM | POA: Diagnosis not present

## 2013-09-27 DIAGNOSIS — Z800 Family history of malignant neoplasm of digestive organs: Secondary | ICD-10-CM | POA: Insufficient documentation

## 2013-09-27 DIAGNOSIS — J449 Chronic obstructive pulmonary disease, unspecified: Secondary | ICD-10-CM | POA: Insufficient documentation

## 2013-09-27 DIAGNOSIS — Z833 Family history of diabetes mellitus: Secondary | ICD-10-CM | POA: Diagnosis not present

## 2013-09-27 DIAGNOSIS — Z09 Encounter for follow-up examination after completed treatment for conditions other than malignant neoplasm: Secondary | ICD-10-CM | POA: Diagnosis not present

## 2013-09-27 DIAGNOSIS — F17200 Nicotine dependence, unspecified, uncomplicated: Secondary | ICD-10-CM | POA: Diagnosis not present

## 2013-09-27 DIAGNOSIS — D128 Benign neoplasm of rectum: Secondary | ICD-10-CM | POA: Diagnosis not present

## 2013-09-27 DIAGNOSIS — Z9049 Acquired absence of other specified parts of digestive tract: Secondary | ICD-10-CM | POA: Insufficient documentation

## 2013-09-27 DIAGNOSIS — Z85828 Personal history of other malignant neoplasm of skin: Secondary | ICD-10-CM | POA: Insufficient documentation

## 2013-09-27 HISTORY — PX: COLONOSCOPY: SHX5424

## 2013-09-27 SURGERY — COLONOSCOPY
Anesthesia: Moderate Sedation

## 2013-09-27 MED ORDER — MEPERIDINE HCL 50 MG/ML IJ SOLN
INTRAMUSCULAR | Status: DC | PRN
  Administered 2013-09-27 (×2): 25 mg via INTRAVENOUS

## 2013-09-27 MED ORDER — SODIUM CHLORIDE 0.9 % IV SOLN
INTRAVENOUS | Status: DC
  Administered 2013-09-27: 07:00:00 via INTRAVENOUS

## 2013-09-27 MED ORDER — MEPERIDINE HCL 50 MG/ML IJ SOLN
INTRAMUSCULAR | Status: AC
  Filled 2013-09-27: qty 1

## 2013-09-27 MED ORDER — MIDAZOLAM HCL 5 MG/5ML IJ SOLN
INTRAMUSCULAR | Status: DC | PRN
  Administered 2013-09-27 (×2): 2 mg via INTRAVENOUS
  Administered 2013-09-27: 1 mg via INTRAVENOUS

## 2013-09-27 MED ORDER — STERILE WATER FOR IRRIGATION IR SOLN
Status: DC | PRN
  Administered 2013-09-27: 08:00:00

## 2013-09-27 MED ORDER — MIDAZOLAM HCL 5 MG/5ML IJ SOLN
INTRAMUSCULAR | Status: AC
  Filled 2013-09-27: qty 10

## 2013-09-27 NOTE — Discharge Instructions (Signed)
Resume usual medications and diet. °No driving for 24 hours. °Physician will call with biopsy results. ° ° °Colon Polyps °Polyps are lumps of extra tissue growing inside the body. Polyps can grow in the large intestine (colon). Most colon polyps are noncancerous (benign). However, some colon polyps can become cancerous over time. Polyps that are larger than a pea may be harmful. To be safe, caregivers remove and test all polyps. °CAUSES  °Polyps form when mutations in the genes cause your cells to grow and divide even though no more tissue is needed. °RISK FACTORS °There are a number of risk factors that can increase your chances of getting colon polyps. They include: °· Being older than 50 years. °· Family history of colon polyps or colon cancer. °· Long-term colon diseases, such as colitis or Crohn disease. °· Being overweight. °· Smoking. °· Being inactive. °· Drinking too much alcohol. °SYMPTOMS  °Most small polyps do not cause symptoms. If symptoms are present, they may include: °· Blood in the stool. The stool may look dark red or black. °· Constipation or diarrhea that lasts longer than 1 week. °DIAGNOSIS °People often do not know they have polyps until their caregiver finds them during a regular checkup. Your caregiver can use 4 tests to check for polyps: °· Digital rectal exam. The caregiver wears gloves and feels inside the rectum. This test would find polyps only in the rectum. °· Barium enema. The caregiver puts a liquid called barium into your rectum before taking X-rays of your colon. Barium makes your colon look white. Polyps are dark, so they are easy to see in the X-ray pictures. °· Sigmoidoscopy. A thin, flexible tube (sigmoidoscope) is placed into your rectum. The sigmoidoscope has a light and tiny camera in it. The caregiver uses the sigmoidoscope to look at the last third of your colon. °· Colonoscopy. This test is like sigmoidoscopy, but the caregiver looks at the entire colon. This is the most  common method for finding and removing polyps. °TREATMENT  °Any polyps will be removed during a sigmoidoscopy or colonoscopy. The polyps are then tested for cancer. °PREVENTION  °To help lower your risk of getting more colon polyps: °· Eat plenty of fruits and vegetables. Avoid eating fatty foods. °· Do not smoke. °· Avoid drinking alcohol. °· Exercise every day. °· Lose weight if recommended by your caregiver. °· Eat plenty of calcium and folate. Foods that are rich in calcium include milk, cheese, and broccoli. Foods that are rich in folate include chickpeas, kidney beans, and spinach. °HOME CARE INSTRUCTIONS °Keep all follow-up appointments as directed by your caregiver. You may need periodic exams to check for polyps. °SEEK MEDICAL CARE IF: °You notice bleeding during a bowel movement. °Document Released: 11/27/2003 Document Revised: 05/25/2011 Document Reviewed: 05/12/2011 °ExitCare® Patient Information ©2015 [employer]. This information is not intended to replace advice given to you by your health care provider. Make sure you discuss any questions you have with your health care provider. °Colonoscopy, Care After °Refer to this sheet in the next few weeks. These instructions provide you with information on caring for yourself after your procedure. Your health care provider may also give you more specific instructions. Your treatment has been planned according to current medical practices, but problems sometimes occur. Call your health care provider if you have any problems or questions after your procedure. °WHAT TO EXPECT AFTER THE PROCEDURE  °After your procedure, it is typical to have the following: °· A small amount of blood in   your stool. °· Moderate amounts of gas and mild abdominal cramping or bloating. °HOME CARE INSTRUCTIONS °· Do not drive, operate machinery, or sign important documents for 24 hours. °· You may shower and resume your regular physical activities, but move at a slower pace for the  first 24 hours. °· Take frequent rest periods for the first 24 hours. °· Walk around or put a warm pack on your abdomen to help reduce abdominal cramping and bloating. °· Drink enough fluids to keep your urine clear or pale yellow. °· You may resume your normal diet as instructed by your health care provider. Avoid heavy or fried foods that are hard to digest. °· Avoid drinking alcohol for 24 hours or as instructed by your health care provider. °· Only take over-the-counter or prescription medicines as directed by your health care provider. °· If a tissue sample (biopsy) was taken during your procedure: °· Do not take aspirin or blood thinners for 7 days, or as instructed by your health care provider. °· Do not drink alcohol for 7 days, or as instructed by your health care provider. °· Eat soft foods for the first 24 hours. °SEEK MEDICAL CARE IF: °You have persistent spotting of blood in your stool 2-3 days after the procedure. °SEEK IMMEDIATE MEDICAL CARE IF: °· You have more than a small spotting of blood in your stool. °· You pass large blood clots in your stool. °· Your abdomen is swollen (distended). °· You have nausea or vomiting. °· You have a fever. °· You have increasing abdominal pain that is not relieved with medicine. °Document Released: 10/15/2003 Document Revised: 12/21/2012 Document Reviewed: 11/07/2012 °ExitCare® Patient Information ©2015 [employer]. This information is not intended to replace advice given to you by your health care provider. Make sure you discuss any questions you have with your health care provider. ° °

## 2013-09-27 NOTE — Op Note (Signed)
COLONOSCOPY PROCEDURE REPORT  PATIENT:  April Wade  MR#:  [id number] Birthdate:  January 02, 1956, 58 y.o., female Endoscopist:  Dr. Rogene Houston, MD Referred By:  Dr. [name], MD  Procedure Date: 09/27/2013  Procedure:   Colonoscopy  Indications:  Patient is 58 year old Caucasian female with history of colonic polyps and carcinoma. She is status post right hemicolectomy for stage II disease in September 2012. 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 She's returning for repeat colonoscopy under fluoroscopy in order to complete the examination.  Informed Consent:  The procedure and risks were reviewed with the patient and informed consent was obtained.  Medications:  Demerol 50 mg IV Versed 5 mg IV  Description of procedure:  After a digital rectal exam was performed, that colonoscope was advanced from the anus through the rectum and colon to the area of hepatic flexure with ileo-colonic anastomosis was identified. Picture taken for record. Scope was then slowly and cautiously withdrawn. The mucosal surfaces were carefully surveyed utilizing scope tip to flexion to facilitate fold flattening as needed. The scope was pulled down into the rectum where a thorough exam including retroflexion was performed.  Findings:   Prep satisfactory. Very redundant sigmoid colon resulting in gamma loop formation. Low profundity reduced under fluoroscopy once the scope was then transverse colon. Wide-open ileocolonic anastomosis located in the region of hepatic flexure. Two small polyps removed from sigmoid colon one with cold biopsy forceps and the other with cold snare. These polyps were submitted together. Small polyp cold snare from rectosigmoid junction and was lost.    Therapeutic/Diagnostic Maneuvers Performed:  See above   Complications:  None  Cecal Withdrawal Time:  15   minutes  Impression:  Examination performed to ileocolonic anastomosis located in the region of hepatic flexure under fluoroscopy. Long redundant sigmoid colon resulting in gamma loop formation which was reduced once the scope tip was in transverse colon. Two small polyps removed from sigmoid colon and submitted together(old biopsy and cold snare). Small polyp cold snare from rectosigmoid junction but was lost. Unremarkable anorectal junction.   Recommendations:  Standard instructions given. I will contact patient with biopsy results and further recommendations.  Loella Hickle U  09/27/2013 8:20 AM  CC: Dr. Hilma Favors, Betsy Coder, MD & Dr. Rayne Du ref. provider found CC; Dr. Scherry Ran, MD

## 2013-09-27 NOTE — H&P (Signed)
April Wade is an 58 y.o. female.   Chief Complaint: Patient is here for colonoscopy. HPI: Patient is 58 year old Caucasian female who underwent screening colonoscopy in August 2012 and found to have multiple adenomas as well as dysplastic large polyp at hepatic flexure for which she underwent right hemicolectomy in September 2012. It was stage II disease. She did not receive adjuvant therapy. She has remained in remission. She'll return for colonoscopy in December 2014 with removal of multiple adenomas including large tubulovillous adenoma rectosigmoid junction. However examination could not be completed because of loop formation. She is therefore returning for repeat exam under fluoroscopy. She denies abdominal pain change in her bowel habits or rectal bleeding except she noted some blood in she took prep yesterday. Family history significant for colonic polyps and 2 brothers and one carcinoma in one first cousin who died in his 39s within 62 months of diagnosis.  Past Medical History  Diagnosis Date  . Headache, migraine   . Borderline diabetic   . Skin cancer of face     colon dx 10/2010  . COPD (chronic obstructive pulmonary disease)   . Shortness of breath   . Asthma   . Adenocarcinoma     abdominal wall  . Arthritis   . Dental caries   . 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 Resection margins were clear and she also had associated appendectomy which was negative for pathology. She had resection on 11/21/2010.     Past Surgical History  Procedure Laterality Date  . Skin cancer excision      [place]  . Colonoscopy  10/29/2010    Procedure: COLONOSCOPY;  Surgeon: Rogene Houston, MD;  [facility];  Service: Endoscopy;  Laterality: N/A;  .  Right hemicolectomy   11/21/2010    Procedure: COLON  RESECTION RIGHT;  Surgeon: Scherry Ran;  Location: [facility];  Service: General;  Laterality: Right;  Right Colectomy, Frozen section.  Pathology notified 11/20/2010  . Mass excision  08/03/2011    Procedure: EXCISION MASS;  Surgeon: Scherry Ran, MD;  Location: [facility];  Service: General;  Laterality: N/A;  Abdominal Wall Mass with repair of abdominal wall defect with proceed mesh  . Abdominal wall defect repair  08/03/2011    Procedure: REPAIR ABDOMINAL WALL;  Surgeon: Scherry Ran, MD;  Location: [facility];  Service: General;  Laterality: N/A;  . Colonoscopy N/A 02/15/2013    Procedure: COLONOSCOPY;  Surgeon: Rogene Houston, MD;  [facility];  Service: Endoscopy;  Laterality: N/A;  1030-moved to 1120 Ann to notify pt  . Colon surgery      Family History  Problem Relation Age of Onset  . Anesthesia problems Neg Hx   . Hypotension Neg Hx   . Malignant hyperthermia Neg Hx   . Pseudochol deficiency Neg Hx   . Hypertension Mother   . Diabetes Father    Social History:  reports that she has been smoking Cigarettes.  She has a 21.5 pack-year smoking history. She does not have any smokeless tobacco history on file. She reports that she does not drink alcohol or use illicit drugs.  Allergies: No Known Allergies  Medications Prior to Admission  Medication Sig Dispense Refill  . cycloSPORINE (RESTASIS) 0.05 % ophthalmic emulsion Place 1 drop into both eyes 2 (two) times  daily.       . fish oil-omega-3 fatty acids 1000 MG capsule Take 1 g by mouth daily.       . meloxicam (MOBIC) 15 MG tablet Take 15 mg by mouth daily as needed for pain.      . potassium chloride SA (K-DUR,KLOR-CON) 20 MEQ tablet Take 1 tablet (20 mEq total) by mouth daily.  30 tablet  1  . tiotropium (SPIRIVA) 18 MCG inhalation capsule Place 18 mcg into inhaler and inhale daily.      . Cholecalciferol (VITAMIN D) 2000 UNITS tablet Take 2,000 Units by mouth daily.          No results found for this or any  previous visit (from the past 48 hour(s)). No results found.  ROS  Blood pressure 127/64, pulse 60, temperature 98.5 F (36.9 C), temperature source Oral, resp. rate 18, height 5\' 6"  (1.676 m), weight 119 lb (53.978 kg), SpO2 98.00%. Physical Exam  Constitutional: She appears well-developed and well-nourished.  HENT:  Mouth/Throat: Oropharynx is clear and moist.  Eyes: Conjunctivae are normal. No scleral icterus.  Neck: No thyromegaly present.  Cardiovascular: Normal rate, regular rhythm and normal heart sounds.   No murmur heard. Respiratory: Effort normal and breath sounds normal.  GI: Soft. She exhibits no mass. There is no tenderness.  Midline scar  Musculoskeletal: She exhibits no edema.  Lymphadenopathy:    She has no cervical adenopathy.  Neurological: She is alert.  Skin: Skin is warm and dry.     Assessment/Plan History of colon carcinoma and colonic adenomas. Status post right hemicolectomy in September 2012. Incomplete colonoscopy December 2014 with removal of multiple adenomas. Colonoscopy under fluoroscopy.  Fortino Haag U 09/27/2013, 7:17 AM

## 2013-09-29 ENCOUNTER — Encounter (HOSPITAL_COMMUNITY): Payer: Self-pay | Admitting: Internal Medicine

## 2013-10-17 ENCOUNTER — Encounter (INDEPENDENT_AMBULATORY_CARE_PROVIDER_SITE_OTHER): Payer: Self-pay | Admitting: *Deleted

## 2013-12-18 ENCOUNTER — Other Ambulatory Visit (HOSPITAL_COMMUNITY): Payer: Medicaid Other

## 2014-02-16 ENCOUNTER — Encounter (HOSPITAL_COMMUNITY): Payer: Medicaid Other | Attending: Hematology and Oncology

## 2014-02-16 DIAGNOSIS — Z23 Encounter for immunization: Secondary | ICD-10-CM | POA: Insufficient documentation

## 2014-02-16 DIAGNOSIS — Z85828 Personal history of other malignant neoplasm of skin: Secondary | ICD-10-CM | POA: Insufficient documentation

## 2014-02-16 DIAGNOSIS — C189 Malignant neoplasm of colon, unspecified: Secondary | ICD-10-CM

## 2014-02-16 DIAGNOSIS — Z85038 Personal history of other malignant neoplasm of large intestine: Secondary | ICD-10-CM | POA: Diagnosis not present

## 2014-02-16 DIAGNOSIS — Z08 Encounter for follow-up examination after completed treatment for malignant neoplasm: Secondary | ICD-10-CM | POA: Diagnosis present

## 2014-02-16 DIAGNOSIS — Z9049 Acquired absence of other specified parts of digestive tract: Secondary | ICD-10-CM | POA: Diagnosis not present

## 2014-02-16 DIAGNOSIS — J449 Chronic obstructive pulmonary disease, unspecified: Secondary | ICD-10-CM | POA: Insufficient documentation

## 2014-02-16 DIAGNOSIS — Z791 Long term (current) use of non-steroidal anti-inflammatories (NSAID): Secondary | ICD-10-CM | POA: Insufficient documentation

## 2014-02-16 DIAGNOSIS — Z86010 Personal history of colonic polyps: Secondary | ICD-10-CM | POA: Diagnosis not present

## 2014-02-16 DIAGNOSIS — F17210 Nicotine dependence, cigarettes, uncomplicated: Secondary | ICD-10-CM | POA: Insufficient documentation

## 2014-02-16 LAB — CBC WITH DIFFERENTIAL/PLATELET
BASOS ABS: 0 10*3/uL (ref 0.0–0.1)
BASOS PCT: 0 % (ref 0–1)
Eosinophils Absolute: 0.2 10*3/uL (ref 0.0–0.7)
Eosinophils Relative: 3 % (ref 0–5)
HEMATOCRIT: 36.1 % (ref 36.0–46.0)
Hemoglobin: 12 g/dL (ref 12.0–15.0)
Lymphocytes Relative: 42 % (ref 12–46)
Lymphs Abs: 3.1 10*3/uL (ref 0.7–4.0)
MCH: 30.2 pg (ref 26.0–34.0)
MCHC: 33.2 g/dL (ref 30.0–36.0)
MCV: 90.9 fL (ref 78.0–100.0)
MONO ABS: 0.6 10*3/uL (ref 0.1–1.0)
Monocytes Relative: 8 % (ref 3–12)
NEUTROS ABS: 3.5 10*3/uL (ref 1.7–7.7)
Neutrophils Relative %: 47 % (ref 43–77)
PLATELETS: 235 10*3/uL (ref 150–400)
RBC: 3.97 MIL/uL (ref 3.87–5.11)
RDW: 15.2 % (ref 11.5–15.5)
WBC: 7.4 10*3/uL (ref 4.0–10.5)

## 2014-02-16 LAB — COMPREHENSIVE METABOLIC PANEL
ALBUMIN: 3.8 g/dL (ref 3.5–5.2)
ALT: 12 U/L (ref 0–35)
AST: 17 U/L (ref 0–37)
Alkaline Phosphatase: 89 U/L (ref 39–117)
Anion gap: 10 (ref 5–15)
BUN: 13 mg/dL (ref 6–23)
CALCIUM: 9.3 mg/dL (ref 8.4–10.5)
CHLORIDE: 104 meq/L (ref 96–112)
CO2: 29 mEq/L (ref 19–32)
Creatinine, Ser: 0.9 mg/dL (ref 0.50–1.10)
GFR calc Af Amer: 80 mL/min — ABNORMAL LOW (ref >90)
GFR calc non Af Amer: 69 mL/min — ABNORMAL LOW (ref >90)
Glucose, Bld: 79 mg/dL (ref 70–99)
Potassium: 3.8 mEq/L (ref 3.7–5.3)
SODIUM: 143 meq/L (ref 137–147)
Total Bilirubin: <0.2 mg/dL — ABNORMAL LOW (ref 0.3–1.2)
Total Protein: 7.5 g/dL (ref 6.0–8.3)

## 2014-02-16 LAB — CEA: CEA: 3.7 ng/mL (ref 0.0–5.0)

## 2014-02-16 NOTE — Progress Notes (Signed)
Labs for cea,cmp,cbcd

## 2014-02-20 ENCOUNTER — Ambulatory Visit (HOSPITAL_COMMUNITY)
Admission: RE | Admit: 2014-02-20 | Discharge: 2014-02-20 | Disposition: A | Discharge status: Home / Self Care | Payer: Medicaid Other | Source: Ambulatory Visit | Attending: Hematology and Oncology | Admitting: Hematology and Oncology

## 2014-02-20 ENCOUNTER — Other Ambulatory Visit (HOSPITAL_COMMUNITY): Payer: Self-pay | Admitting: Hematology and Oncology

## 2014-02-20 DIAGNOSIS — C189 Malignant neoplasm of colon, unspecified: Secondary | ICD-10-CM

## 2014-02-21 ENCOUNTER — Encounter (HOSPITAL_BASED_OUTPATIENT_CLINIC_OR_DEPARTMENT_OTHER): Payer: Medicaid Other

## 2014-02-21 ENCOUNTER — Ambulatory Visit (HOSPITAL_COMMUNITY)
Admission: RE | Admit: 2014-02-21 | Discharge: 2014-02-21 | Disposition: A | Discharge status: Home / Self Care | Payer: Medicaid Other | Source: Ambulatory Visit | Attending: Hematology and Oncology | Admitting: Hematology and Oncology

## 2014-02-21 ENCOUNTER — Encounter (HOSPITAL_COMMUNITY): Payer: Self-pay

## 2014-02-21 VITALS — BP 102/46 | HR 71 | Temp 98.7°F | Resp 18 | Wt 127.5 lb

## 2014-02-21 DIAGNOSIS — E119 Type 2 diabetes mellitus without complications: Secondary | ICD-10-CM | POA: Insufficient documentation

## 2014-02-21 DIAGNOSIS — C189 Malignant neoplasm of colon, unspecified: Secondary | ICD-10-CM | POA: Diagnosis present

## 2014-02-21 DIAGNOSIS — Z23 Encounter for immunization: Secondary | ICD-10-CM

## 2014-02-21 DIAGNOSIS — J449 Chronic obstructive pulmonary disease, unspecified: Secondary | ICD-10-CM

## 2014-02-21 MED ORDER — IOHEXOL 300 MG/ML  SOLN
100.0000 mL | Freq: Once | INTRAMUSCULAR | Status: AC | PRN
  Administered 2014-02-21: 100 mL via INTRAVENOUS

## 2014-02-21 MED ORDER — INFLUENZA VAC SPLIT QUAD 0.5 ML IM SUSY
0.5000 mL | PREFILLED_SYRINGE | Freq: Once | INTRAMUSCULAR | Status: AC
  Administered 2014-02-21: 0.5 mL via INTRAMUSCULAR
  Filled 2014-02-21: qty 0.5

## 2014-02-21 NOTE — Progress Notes (Signed)
April Wade presents today for injection per MD orders. Flu vaccine administered IM in left deltoid. Administration without incident. Patient tolerated well.

## 2014-02-21 NOTE — Progress Notes (Signed)
[place]  OFFICE PROGRESS NOTE  [name], MD [address] [place] [address]  DIAGNOSIS: Adenocarcinoma of colon  Chief Complaint  Patient presents with  . Colon Cancer    CURRENT THERAPY: Watchful expectation.  INTERVAL HISTORY: April Wade 58 y.o. female returns for followup of stage II colon cancer, status post right hemicolectomy with postop hernia requiring mesh performed on 08/03/2011, no postop treatment with original surgery on 11/21/2010. Repeat colonoscopy on 09/27/2013 revealed a long redundant sigmoid colon with 2 small polyps removed from the sigmoid area with an unremarkable anorectal junction. Pathology showed a tubular adenoma. Appetite is good with no episodes of nausea, vomiting, melena, hematochezia, hematuria, vaginal bleeding, lower extremity swelling or redness, PND, orthopnea, or palpitations. She does have occasional wheezing which has been controlled well with Spiriva. She denies any headache, skin rash, joint pain or swelling, but is anticipating loose bowel movements tonight after undergoing CT scanning today with contrast.  MEDICAL HISTORY: Past Medical History  Diagnosis Date  . Headache, migraine   . Borderline diabetic   . Skin cancer of face     colon dx 10/2010  . COPD (chronic obstructive pulmonary disease)   . Shortness of breath   . Asthma   . Adenocarcinoma     abdominal wall  . Arthritis   . Dental caries   . 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 Resection margins were clear and she also had associated appendectomy which was negative for pathology. She had resection on 11/21/2010.     INTERIM HISTORY: has Adenocarcinoma of colon on her problem list.    ALLERGIES:  has No Known  Allergies.  MEDICATIONS: has a current medication list which includes the following prescription(s): vitamin d, cyclosporine, fish oil-omega-3 fatty acids, meloxicam, potassium chloride sa, and tiotropium.  SURGICAL HISTORY:  Past Surgical History  Procedure Laterality Date  . Skin cancer excision      [place]  . Colonoscopy  10/29/2010    Procedure: COLONOSCOPY;  Surgeon: Rogene Houston, MD;  [facility];  Service: Endoscopy;  Laterality: N/A;  . Colostomy revision  11/21/2010    Procedure: COLON RESECTION RIGHT;  Surgeon: Scherry Ran;  Location: [facility];  Service: General;  Laterality: Right;  Right Colectomy, Frozen section.  Pathology notified 11/20/2010  . Mass excision  08/03/2011    Procedure: EXCISION MASS;  Surgeon: Scherry Ran, MD;  Location: [facility];  Service: General;  Laterality: N/A;  Abdominal Wall Mass with repair of abdominal wall defect with proceed mesh  . Abdominal wall defect repair  08/03/2011    Procedure: REPAIR ABDOMINAL WALL;  Surgeon: Scherry Ran, MD;  Location: [facility];  Service: General;  Laterality: N/A;  . Colonoscopy N/A 02/15/2013    Procedure: COLONOSCOPY;  Surgeon: Rogene Houston, MD;  [facility];  Service: Endoscopy;  Laterality: N/A;  1030-moved to 1120 Ann to notify pt  . Colon surgery    . Colonoscopy N/A 09/27/2013    Procedure: COLONOSCOPY;  Surgeon: Rogene Houston, MD;  [facility];  Service: Endoscopy;  Laterality: N/A;  730 -- with fluoro  . Skin cancer excision  2015    skin cancer removed from nose    FAMILY HISTORY: family history  includes Diabetes in her father; Hypertension in her mother. There is no history of Anesthesia problems, Hypotension, Malignant hyperthermia, or Pseudochol deficiency.  SOCIAL HISTORY:  reports that she has been smoking Cigarettes.  She has a 21.5 pack-year smoking history. She has never used smokeless tobacco. She reports that she does not drink alcohol or use illicit  drugs.  REVIEW OF SYSTEMS:  Other than that discussed above is noncontributory.  PHYSICAL EXAMINATION: ECOG PERFORMANCE STATUS: 1 - Symptomatic but completely ambulatory  Blood pressure 102/46, pulse 71, temperature 98.7 F (37.1 C), temperature source Oral, resp. rate 18, weight 127 lb 8 oz (57.834 kg), SpO2 98 %.  GENERAL:alert, no distress and comfortable SKIN: skin color, texture, turgor are normal, no rashes or significant lesions EYES: PERLA; Conjunctiva are pink and non-injected, sclera clear SINUSES: No redness or tenderness over maxillary or ethmoid sinuses OROPHARYNX:no exudate, no erythema on lips, buccal mucosa, or tongue. NECK: supple, thyroid normal size, non-tender, without nodularity. No masses CHEST: increased AP diameter with fibrocystic changes in both breasts. LYMPH:  no palpable lymphadenopathy in the cervical, axillary or inguinal LUNGS: clear to auscultation and percussion with normal breathing effort HEART: regular rate & rhythm and no murmurs. ABDOMEN:abdomen soft, non-tender and normal bowel sounds. Liver and spleen not enlarged. No free fluid wave or shifting dullness. MUSCULOSKELETAL:no cyanosis of digits and no clubbing. Range of motion normal.  NEURO: alert & oriented x 3 with fluent speech, no focal motor/sensory deficits   LABORATORY DATA: Lab on 02/16/2014  Component Date Value Ref Range Status  . WBC 02/16/2014 7.4  4.0 - 10.5 K/uL Final  . RBC 02/16/2014 3.97  3.87 - 5.11 MIL/uL Final  . Hemoglobin 02/16/2014 12.0  12.0 - 15.0 g/dL Final  . HCT 02/16/2014 36.1  36.0 - 46.0 % Final  . MCV 02/16/2014 90.9  78.0 - 100.0 fL Final  . MCH 02/16/2014 30.2  26.0 - 34.0 pg Final  . MCHC 02/16/2014 33.2  30.0 - 36.0 g/dL Final  . RDW 02/16/2014 15.2  11.5 - 15.5 % Final  . Platelets 02/16/2014 235  150 - 400 K/uL Final  . Neutrophils Relative % 02/16/2014 47  43 - 77 % Final  . Neutro Abs 02/16/2014 3.5  1.7 - 7.7 K/uL Final  . Lymphocytes Relative  02/16/2014 42  12 - 46 % Final  . Lymphs Abs 02/16/2014 3.1  0.7 - 4.0 K/uL Final  . Monocytes Relative 02/16/2014 8  3 - 12 % Final  . Monocytes Absolute 02/16/2014 0.6  0.1 - 1.0 K/uL Final  . Eosinophils Relative 02/16/2014 3  0 - 5 % Final  . Eosinophils Absolute 02/16/2014 0.2  0.0 - 0.7 K/uL Final  . Basophils Relative 02/16/2014 0  0 - 1 % Final  . Basophils Absolute 02/16/2014 0.0  0.0 - 0.1 K/uL Final  . Sodium 02/16/2014 143  137 - 147 mEq/L Final  . Potassium 02/16/2014 3.8  3.7 - 5.3 mEq/L Final  . Chloride 02/16/2014 104  96 - 112 mEq/L Final  . CO2 02/16/2014 29  19 - 32 mEq/L Final  . Glucose, Bld 02/16/2014 79  70 - 99 mg/dL Final  . BUN 02/16/2014 13  6 - 23 mg/dL Final  . Creatinine, Ser 02/16/2014 0.90  0.50 - 1.10 mg/dL Final  . Calcium 02/16/2014 9.3  8.4 - 10.5 mg/dL Final  . Total Protein 02/16/2014 7.5  6.0 - 8.3 g/dL Final  . Albumin 02/16/2014 3.8  3.5 - 5.2 g/dL Final  .  AST 02/16/2014 17  0 - 37 U/L Final  . ALT 02/16/2014 12  0 - 35 U/L Final  . Alkaline Phosphatase 02/16/2014 89  39 - 117 U/L Final  . Total Bilirubin 02/16/2014 <0.2* 0.3 - 1.2 mg/dL Final  . GFR calc non Af Amer 02/16/2014 69* >90 mL/min Final  . GFR calc Af Amer 02/16/2014 80* >90 mL/min Final   Comment: (NOTE) The eGFR has been calculated using the CKD EPI equation. This calculation has not been validated in all clinical situations. eGFR's persistently <90 mL/min signify possible Chronic Kidney Disease.   Georgiann Hahn gap 02/16/2014 10  5 - 15 Final  . CEA 02/16/2014 3.7  0.0 - 5.0 ng/mL Final   Performed at [place]:  FINAL for OMESHA, BOWERMAN ([id number]) Patient: ALAENA, STRADER Collected: 09/27/2013 Client: [facility] Accession: [id number] Received: 09/27/2013 Hildred Laser DOB: Mar 12, 1956 Age: 10 Gender: F Reported: 09/28/2013 [address] Patient Ph: [phone] MRN #: [id number] Linna Hoff [employer] [address] Visit #: [id number] Chart #:  Phone: [phone] Fax: CC: REPORT OF SURGICAL PATHOLOGY FINAL DIAGNOSIS Diagnosis Colon, polyp(s), sigmoid - TUBULAR ADENOMA (ONE FRAGMENT) - HYPERPLASTIC POLYP. - NO EVIDENCE OF HIGH GRADE DYSPLASIA OR MALIGNANCY. Aldona Bar MD Pathologist, Electronic Signature (Case signed 09/28/2013) Specimen Gross and Clinical Information Specimen(s) Obtained: Colon, polyp(s), sigmoid Specimen Clinical Information hx colon ca hx polyps Gross In formalin are three tan polypoid tissues (0.2, 0.4, 1.0 cm). The largest is bisected and the tissue are submitted in one block. (SW:ds 09/27/13) Report signed out from the following location(s) Technical Component and Interpretation performed at [email or web address]    Urinalysis No results found for: COLORURINE, APPEARANCEUR, LABSPEC, PHURINE, GLUCOSEU, HGBUR, BILIRUBINUR, KETONESUR, PROTEINUR, UROBILINOGEN, NITRITE, LEUKOCYTESUR  RADIOGRAPHIC STUDIES: Ct Chest W Contrast  02/21/2014   CLINICAL DATA:  Colon cancer, restaging/surveillance. Diagnosis was 2 years ago, partial colectomy. Diabetes.  EXAM: CT CHEST, ABDOMEN, AND PELVIS WITH CONTRAST  TECHNIQUE: Multidetector CT imaging of the chest, abdomen and pelvis was performed following the standard protocol during bolus administration of intravenous contrast.  CONTRAST:  162m OMNIPAQUE IOHEXOL 300 MG/ML  SOLN  COMPARISON:  Multiple exams, including 01/30/2013 and 12/22/2010.  FINDINGS: CT CHEST FINDINGS  If biapical pleural parenchymal scarring. No pathologic adenopathy. Densely calcified left breast lesion, likely a fibroadenoma or similar benign lesion, no change from 2012.  Mild atherosclerotic calcification of the aortic arch and proximal branch vasculature. Mild lower lingular airspace opacity is present, images 46-50 of series 6, primarily from a atelectasis/volume loss.  CT ABDOMEN AND PELVIS FINDINGS  Hepatobiliary: Unremarkable  Pancreas: Unremarkable  Spleen: Unremarkable   Adrenals/Urinary Tract: Unremarkable  Stomach/Bowel: Unremarkable.  Vascular/Lymphatic: Aortoiliac atherosclerotic vascular disease. 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 No pathologic adenopathy identified.  Reproductive: Unremarkable  Other: No supplemental non-categorized findings.  Musculoskeletal: Lumbar spondylosis and degenerative disc disease, with intervertebral and facet spurring along with disc bulge causing mild bilateral foraminal stenosis at L5-S1. Disc uncovering due to degenerative anterolisthesis at L4-5.  IMPRESSION: 1. No recurrent malignancy identified. 2. 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 3. Mild atelectasis inferiorly in the lingula. 4. Lumbar spondylosis and degenerative disc disease, causing mild bilateral foraminal stenosis at L5-S1.   Electronically Signed   By: WSherryl BartersM.D.   On: 02/21/2014 14:50   Ct Abdomen Pelvis W Contrast  02/21/2014   CLINICAL DATA:  Colon cancer, restaging/surveillance.  Diagnosis was 2 years ago, partial colectomy. Diabetes.  EXAM: CT CHEST, ABDOMEN, AND PELVIS WITH CONTRAST  TECHNIQUE: Multidetector CT imaging of the chest, abdomen and pelvis was performed following the standard protocol during bolus administration of intravenous contrast.  CONTRAST:  171m OMNIPAQUE IOHEXOL 300 MG/ML  SOLN  COMPARISON:  Multiple exams, including 01/30/2013 and 12/22/2010.  FINDINGS: CT CHEST FINDINGS  If biapical pleural parenchymal scarring. No pathologic adenopathy. Densely calcified left breast lesion, likely a fibroadenoma or similar benign lesion, no change from 2012.  Mild atherosclerotic calcification of the aortic arch and proximal branch vasculature. Mild lower lingular airspace opacity is present, images 46-50 of series 6, primarily from a atelectasis/volume loss.  CT ABDOMEN AND PELVIS FINDINGS  Hepatobiliary: Unremarkable  Pancreas: Unremarkable  Spleen: Unremarkable  Adrenals/Urinary Tract:  Unremarkable  Stomach/Bowel: Unremarkable.  Vascular/Lymphatic: Aortoiliac atherosclerotic vascular disease. 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 No pathologic adenopathy identified.  Reproductive: Unremarkable  Other: No supplemental non-categorized findings.  Musculoskeletal: Lumbar spondylosis and degenerative disc disease, with intervertebral and facet spurring along with disc bulge causing mild bilateral foraminal stenosis at L5-S1. Disc uncovering due to degenerative anterolisthesis at L4-5.  IMPRESSION: 1. No recurrent malignancy identified. 2. 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 3. Mild atelectasis inferiorly in the lingula. 4. Lumbar spondylosis and degenerative disc disease, causing mild bilateral foraminal stenosis at L5-S1.   Electronically Signed   By: WSherryl BartersM.D.   On: 02/21/2014 14:50    ASSESSMENT:  #1. Stage II A. moderately differentiated adenocarcinoma of the right colon with no evidence of LV I., 15 lymph nodes negative, stage IIA, status post resection on 11/21/2010 with no postoperative therapy, associated appendectomy was performed concurrently, NO EVIDENCE OF DISEASE. #2. Status post resection of abdominal hernia with mesh, good surgical result with no evidence of repeat herniation. #3. Chronic obstructive pulmonary disease. #4. Basal cell carcinoma of the nose, status post resection with excellent cosmetic result. #5. Status post colonoscopy in July 2015 with tubular adenoma resected.   PLAN:  #1. Continue watchful expectation. #2. Influenza virus vaccine to be given today. #3. Follow-up in 6 months with CBC, chem profile, CEA. Repeat CT scan will be done in December 2016.   All questions were answered. The patient knows to call the clinic with any problems, questions or concerns. We can certainly see the patient much sooner if necessary.   I spent 25 minutes counseling the patient face to face. The  total time spent in the appointment was 30 minutes.    FDoroteo Bradford MD 02/21/2014 3:18 PM  DISCLAIMER:  This note was dictated with voice recognition software.  Similar sounding words can inadvertently be transcribed inaccurately and may not be corrected upon review.

## 2014-02-21 NOTE — Patient Instructions (Signed)
[facility] Discharge Instructions  RECOMMENDATIONS MADE BY THE CONSULTANT AND ANY TEST RESULTS WILL BE SENT TO YOUR REFERRING PHYSICIAN.  EXAM FINDINGS BY THE PHYSICIAN TODAY AND SIGNS OR SYMPTOMS TO REPORT TO CLINIC OR PRIMARY PHYSICIAN: Exam and findings as discussed by Dr. Barnet Glasgow.  Labs are good. If any concerns with your CT scans we will contact you. Flu vaccine today.     INSTRUCTIONS/FOLLOW-UP: Labs and office visit in 6 months.  Thank you for choosing [facility] to provide your oncology and hematology care.  To afford each patient quality time with our providers, please arrive at least 15 minutes before your scheduled appointment time.  With your help, our goal is to use those 15 minutes to complete the necessary work-up to ensure our physicians have the information they need to help with your evaluation and healthcare recommendations.    Effective January 1st, 2014, we ask that you re-schedule your appointment with our physicians should you arrive 10 or more minutes late for your appointment.  We strive to give you quality time with our providers, and arriving late affects you and other patients whose appointments are after yours.    Again, thank you for choosing [facility].  Our hope is that these requests will decrease the amount of time that you wait before being seen by our physicians.       _____________________________________________________________  Should you have questions after your visit to [facility], please contact our office at [phone] between the hours of 8:30 a.m. and 4:30 p.m.  Voicemails left after 4:30 p.m. will not be returned until the following business day.  For prescription refill requests, have your pharmacy contact our office with your prescription refill request.    _______________________________________________________________  We hope that we have given you very good care.  You may  receive a patient satisfaction survey in the mail, please complete it and return it as soon as possible.  We value your feedback!  _______________________________________________________________  Have you asked about our STAR program?  STAR stands for Survivorship Training and Rehabilitation, and this is a nationally recognized cancer care program that focuses on survivorship and rehabilitation.  Cancer and cancer treatments may cause problems, such as, pain, making you feel tired and keeping you from doing the things that you need or want to do. Cancer rehabilitation can help. Our goal is to reduce these troubling effects and help you have the best quality of life possible.  You may receive a survey from a nurse that asks questions about your current state of health.  Based on the survey results, all eligible patients will be referred to the [facility] program for an evaluation so we can better serve you!  A frequently asked questions sheet is available upon request.

## 2014-02-22 ENCOUNTER — Telehealth (HOSPITAL_COMMUNITY): Payer: Self-pay | Admitting: *Deleted

## 2014-02-22 ENCOUNTER — Ambulatory Visit (HOSPITAL_COMMUNITY): Payer: Medicaid Other

## 2014-02-22 NOTE — Telephone Encounter (Signed)
Spoke with patient regarding CT results. Pleased with outcome.

## 2014-02-22 NOTE — Telephone Encounter (Signed)
-----   Message from Farrel Gobble, MD sent at 02/21/2014  3:05 PM EST ----- Please call S[facility] and tell her that CAT scans showed no evidence of cancer. Thank you

## 2014-02-23 NOTE — Progress Notes (Signed)
This encounter was created in error - please disregard.

## 2014-03-25 ENCOUNTER — Encounter (HOSPITAL_COMMUNITY): Payer: Self-pay | Admitting: Emergency Medicine

## 2014-03-25 ENCOUNTER — Emergency Department (HOSPITAL_COMMUNITY)
Admission: EM | Admit: 2014-03-25 | Discharge: 2014-03-26 | Disposition: A | Discharge status: Home / Self Care | Payer: Medicaid Other | Attending: Emergency Medicine | Admitting: Emergency Medicine

## 2014-03-25 ENCOUNTER — Emergency Department (HOSPITAL_COMMUNITY): Payer: Medicaid Other

## 2014-03-25 DIAGNOSIS — Z792 Long term (current) use of antibiotics: Secondary | ICD-10-CM | POA: Insufficient documentation

## 2014-03-25 DIAGNOSIS — R05 Cough: Secondary | ICD-10-CM

## 2014-03-25 DIAGNOSIS — E876 Hypokalemia: Secondary | ICD-10-CM | POA: Insufficient documentation

## 2014-03-25 DIAGNOSIS — Z8719 Personal history of other diseases of the digestive system: Secondary | ICD-10-CM | POA: Insufficient documentation

## 2014-03-25 DIAGNOSIS — Z85038 Personal history of other malignant neoplasm of large intestine: Secondary | ICD-10-CM | POA: Insufficient documentation

## 2014-03-25 DIAGNOSIS — Z85820 Personal history of malignant melanoma of skin: Secondary | ICD-10-CM | POA: Insufficient documentation

## 2014-03-25 DIAGNOSIS — Z8679 Personal history of other diseases of the circulatory system: Secondary | ICD-10-CM | POA: Diagnosis not present

## 2014-03-25 DIAGNOSIS — J441 Chronic obstructive pulmonary disease with (acute) exacerbation: Secondary | ICD-10-CM | POA: Diagnosis not present

## 2014-03-25 DIAGNOSIS — J189 Pneumonia, unspecified organism: Secondary | ICD-10-CM

## 2014-03-25 DIAGNOSIS — M1990 Unspecified osteoarthritis, unspecified site: Secondary | ICD-10-CM | POA: Diagnosis not present

## 2014-03-25 DIAGNOSIS — R059 Cough, unspecified: Secondary | ICD-10-CM

## 2014-03-25 LAB — BASIC METABOLIC PANEL
Anion gap: 7 (ref 5–15)
BUN: 9 mg/dL (ref 6–23)
CALCIUM: 8.7 mg/dL (ref 8.4–10.5)
CO2: 31 mmol/L (ref 19–32)
Chloride: 99 mEq/L (ref 96–112)
Creatinine, Ser: 0.73 mg/dL (ref 0.50–1.10)
GFR calc Af Amer: >90 mL/min (ref >90)
GFR calc non Af Amer: >90 mL/min (ref >90)
Glucose, Bld: 99 mg/dL (ref 70–99)
Potassium: 2.8 mmol/L — ABNORMAL LOW (ref 3.5–5.1)
Sodium: 137 mmol/L (ref 135–145)

## 2014-03-25 LAB — CBC WITH DIFFERENTIAL/PLATELET
Basophils Absolute: 0 10*3/uL (ref 0.0–0.1)
Basophils Relative: 0 % (ref 0–1)
Eosinophils Absolute: 0.1 10*3/uL (ref 0.0–0.7)
Eosinophils Relative: 1 % (ref 0–5)
HEMATOCRIT: 33.2 % — AB (ref 36.0–46.0)
Hemoglobin: 11 g/dL — ABNORMAL LOW (ref 12.0–15.0)
LYMPHS ABS: 2.8 10*3/uL (ref 0.7–4.0)
LYMPHS PCT: 26 % (ref 12–46)
MCH: 30.2 pg (ref 26.0–34.0)
MCHC: 33.1 g/dL (ref 30.0–36.0)
MCV: 91.2 fL (ref 78.0–100.0)
MONOS PCT: 7 % (ref 3–12)
Monocytes Absolute: 0.8 10*3/uL (ref 0.1–1.0)
NEUTROS ABS: 6.9 10*3/uL (ref 1.7–7.7)
Neutrophils Relative %: 66 % (ref 43–77)
Platelets: 277 10*3/uL (ref 150–400)
RBC: 3.64 MIL/uL — ABNORMAL LOW (ref 3.87–5.11)
RDW: 14.1 % (ref 11.5–15.5)
WBC: 10.5 10*3/uL (ref 4.0–10.5)

## 2014-03-25 LAB — TROPONIN I

## 2014-03-25 MED ORDER — LIDOCAINE HCL (PF) 1 % IJ SOLN
INTRAMUSCULAR | Status: AC
  Administered 2014-03-25: 23:00:00
  Filled 2014-03-25: qty 5

## 2014-03-25 MED ORDER — HYDROCODONE-ACETAMINOPHEN 5-325 MG PO TABS: 1.0000 | ORAL_TABLET | ORAL | Status: DC | PRN

## 2014-03-25 MED ORDER — DOXYCYCLINE HYCLATE 100 MG PO TABS
100.0000 mg | ORAL_TABLET | Freq: Once | ORAL | Status: AC
  Administered 2014-03-25: 100 mg via ORAL
  Filled 2014-03-25: qty 1

## 2014-03-25 MED ORDER — POTASSIUM CHLORIDE CRYS ER 20 MEQ PO TBCR
40.0000 meq | EXTENDED_RELEASE_TABLET | Freq: Once | ORAL | Status: AC
  Administered 2014-03-25: 40 meq via ORAL
  Filled 2014-03-25: qty 2

## 2014-03-25 MED ORDER — HYDROCODONE-ACETAMINOPHEN 5-325 MG PO TABS
1.0000 | ORAL_TABLET | Freq: Once | ORAL | Status: AC
  Administered 2014-03-25: 1 via ORAL
  Filled 2014-03-25: qty 1

## 2014-03-25 MED ORDER — DOXYCYCLINE HYCLATE 100 MG PO CAPS: 100.0000 mg | ORAL_CAPSULE | Freq: Two times a day (BID) | ORAL | Status: DC

## 2014-03-25 MED ORDER — CEFTRIAXONE SODIUM 1 G IJ SOLR
1.0000 g | Freq: Once | INTRAMUSCULAR | Status: AC
  Administered 2014-03-25: 1 g via INTRAMUSCULAR
  Filled 2014-03-25: qty 10

## 2014-03-25 NOTE — Discharge Instructions (Signed)
Pneumonia Pneumonia is an infection of the lungs.  CAUSES Pneumonia may be caused by bacteria or a virus. Usually, these infections are caused by breathing infectious particles into the lungs (respiratory tract). SIGNS AND SYMPTOMS   Cough.  Fever.  Chest pain.  Increased rate of breathing.  Wheezing.  Mucus production. DIAGNOSIS  If you have the common symptoms of pneumonia, your health care provider will typically confirm the diagnosis with a chest X-ray. The X-ray will show an abnormality in the lung (pulmonary infiltrate) if you have pneumonia. Other tests of your blood, urine, or sputum may be done to find the specific cause of your pneumonia. Your health care provider may also do tests (blood gases or pulse oximetry) to see how well your lungs are working. TREATMENT  Some forms of pneumonia may be spread to other people when you cough or sneeze. You may be asked to wear a mask before and during your exam. Pneumonia that is caused by bacteria is treated with antibiotic medicine. Pneumonia that is caused by the influenza virus may be treated with an antiviral medicine. Most other viral infections must run their course. These infections will not respond to antibiotics.  HOME CARE INSTRUCTIONS   Cough suppressants may be used if you are losing too much rest. However, coughing protects you by clearing your lungs. You should avoid using cough suppressants if you can.  Your health care provider may have prescribed medicine if he or she thinks your pneumonia is caused by bacteria or influenza. Finish your medicine even if you start to feel better.  Your health care provider may also prescribe an expectorant. This loosens the mucus to be coughed up.  Take medicines only as directed by your health care provider.  Do not smoke. Smoking is a common cause of bronchitis and can contribute to pneumonia. If you are a smoker and continue to smoke, your cough may last several weeks after your  pneumonia has cleared.  A cold steam vaporizer or humidifier in your room or home may help loosen mucus.  Coughing is often worse at night. Sleeping in a semi-upright position in a recliner or using a couple pillows under your head will help with this.  Get rest as you feel it is needed. Your body will usually let you know when you need to rest. PREVENTION A pneumococcal shot (vaccine) is available to prevent a common bacterial cause of pneumonia. This is usually suggested for:  People over 65 years old.  Patients on chemotherapy.  People with chronic lung problems, such as bronchitis or emphysema.  People with immune system problems. If you are over 65 or have a high risk condition, you may receive the pneumococcal vaccine if you have not received it before. In some countries, a routine influenza vaccine is also recommended. This vaccine can help prevent some cases of pneumonia.You may be offered the influenza vaccine as part of your care. If you smoke, it is time to quit. You may receive instructions on how to stop smoking. Your health care provider can provide medicines and counseling to help you quit. SEEK MEDICAL CARE IF: You have a fever. SEEK IMMEDIATE MEDICAL CARE IF:   Your illness becomes worse. This is especially true if you are elderly or weakened from any other disease.  You cannot control your cough with suppressants and are losing sleep.  You begin coughing up blood.  You develop pain which is getting worse or is uncontrolled with medicines.  Any of the symptoms   which initially brought you in for treatment are getting worse rather than better.  You develop shortness of breath or chest pain. MAKE SURE YOU:   Understand these instructions.  Will watch your condition.  Will get help right away if you are not doing well or get worse. Document Released: 03/02/2005 Document Revised: 07/17/2013 Document Reviewed: 05/22/2010 [facility] Patient Information 2015  [place], [place]. This information is not intended to replace advice given to you by your health care provider. Make sure you discuss any questions you have with your health care provider.   Take your next dose of the antibiotic tomorrow morning.  Use your nebulizer or your inhaler every 4 hours if you are wheezing or short of breath.  Return here or call your doctor for a prompt recheck if you develop worsened symptoms including worsening fever, shortness of breath or weakness.  Increase your potassium for the next 5 days, taking it morning and evening to help bump up your potassium level.    Hypokalemia Hypokalemia means that the amount of potassium in the blood is lower than normal.Potassium is a chemical, called an electrolyte, that helps regulate the amount of fluid in the body. It also stimulates muscle contraction and helps nerves function properly.Most of the body's potassium is inside of cells, and only a very small amount is in the blood. Because the amount in the blood is so small, minor changes can be life-threatening. CAUSES  Antibiotics.  Diarrhea or vomiting.  Using laxatives too much, which can cause diarrhea.  Chronic kidney disease.  Water pills (diuretics).  Eating disorders (bulimia).  Low magnesium level.  Sweating a lot. SIGNS AND SYMPTOMS  Weakness.  Constipation.  Fatigue.  Muscle cramps.  Mental confusion.  Skipped heartbeats or irregular heartbeat (palpitations).  Tingling or numbness. DIAGNOSIS  Your health care provider can diagnose hypokalemia with blood tests. In addition to checking your potassium level, your health care provider may also check other lab tests. TREATMENT Hypokalemia can be treated with potassium supplements taken by mouth or adjustments in your current medicines. If your potassium level is very low, you may need to get potassium through a vein (IV) and be monitored in the hospital. A diet high in potassium is also helpful. Foods  high in potassium are:  Nuts, such as peanuts and pistachios.  Seeds, such as sunflower seeds and pumpkin seeds.  Peas, lentils, and lima beans.  Whole grain and bran cereals and breads.  Fresh fruit and vegetables, such as apricots, avocado, bananas, cantaloupe, kiwi, oranges, tomatoes, asparagus, and potatoes.  Orange and tomato juices.  Red meats.  Fruit yogurt. HOME CARE INSTRUCTIONS  Take all medicines as prescribed by your health care provider.  Maintain a healthy diet by including nutritious food, such as fruits, vegetables, nuts, whole grains, and lean meats.  If you are taking a laxative, be sure to follow the directions on the label. SEEK MEDICAL CARE IF:  Your weakness gets worse.  You feel your heart pounding or racing.  You are vomiting or having diarrhea.  You are diabetic and having trouble keeping your blood glucose in the normal range. SEEK IMMEDIATE MEDICAL CARE IF:  You have chest pain, shortness of breath, or dizziness.  You are vomiting or having diarrhea for more than 2 days.  You faint. MAKE SURE YOU:   Understand these instructions.  Will watch your condition.  Will get help right away if you are not doing well or get worse. Document Released: 03/02/2005 Document Revised: 12/21/2012  Document Reviewed: 09/02/2012 [facility] Patient Information 2015 [place], [place]. This information is not intended to replace advice given to you by your health care provider. Make sure you discuss any questions you have with your health care provider.

## 2014-03-25 NOTE — ED Notes (Signed)
Patient complaining of cough that started yesterday. States hurts in lower ribs with coughing. Patient states, "I just don't feel good." Patient states is aching all over.

## 2014-03-26 NOTE — ED Notes (Signed)
Discharge instructions given, pt demonstrated teach back and verbal understanding. No concerns voiced.  

## 2014-03-27 NOTE — ED Provider Notes (Signed)
CSN: [id number]     Arrival date & time 03/25/14  2002 History   First MD Initiated Contact with Patient 03/25/14 2204     Chief Complaint  Patient presents with  . Cough     (Consider location/radiation/quality/duration/timing/severity/associated sxs/prior Treatment) The history is provided by the patient.   April Wade is a 59 y.o. female with a one week history of uri type symptoms including cough, nasal congestion and mild throat irritation, advancing to a more wet sounding and sometimes productive of yellow sputum cough along with bilateral lower rib cage soreness from frequency of cough. She denies sob.  She is fatigued and endorses generalized body aches with subjective fevers.  She has a history of COPD, has occasional wheezing not worse with this infection and taking albuterol mdi plus nebs bid, spiriva.  She is not in home oxygen.  She has used otc cough medications without relief.  Denies nausea, vomiting, abdominal pain, dizziness.     Past Medical History  Diagnosis Date  . Headache, migraine   . Borderline diabetic   . Skin cancer of face     colon dx 10/2010  . COPD (chronic obstructive pulmonary disease)   . Shortness of breath   . Asthma   . Adenocarcinoma     abdominal wall  . Arthritis   . Dental caries   . 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 Resection margins were clear and she also had associated appendectomy which was negative for pathology. She had resection on 11/21/2010.    Past Surgical History  Procedure Laterality Date  . Skin cancer excision      [place]  . Colonoscopy  10/29/2010    Procedure: COLONOSCOPY;  Surgeon: Rogene Houston, MD;  Location: [facility];  Service: Endoscopy;  Laterality: N/A;  . Colostomy revision  11/21/2010    Procedure: COLON RESECTION RIGHT;   Surgeon: Scherry Ran;  Location: [facility];  Service: General;  Laterality: Right;  Right Colectomy, Frozen section.  Pathology notified 11/20/2010  . Mass excision  08/03/2011    Procedure: EXCISION MASS;  Surgeon: Scherry Ran, MD;  Location: [facility];  Service: General;  Laterality: N/A;  Abdominal Wall Mass with repair of abdominal wall defect with proceed mesh  . Abdominal wall defect repair  08/03/2011    Procedure: REPAIR ABDOMINAL WALL;  Surgeon: Scherry Ran, MD;  Location: [facility];  Service: General;  Laterality: N/A;  . Colonoscopy N/A 02/15/2013    Procedure: COLONOSCOPY;  Surgeon: Rogene Houston, MD;  Location: [facility];  Service: Endoscopy;  Laterality: N/A;  1030-moved to 1120 Ann to notify pt  . Colon surgery    . Colonoscopy N/A 09/27/2013    Procedure: COLONOSCOPY;  Surgeon: Rogene Houston, MD;  Location: [facility];  Service: Endoscopy;  Laterality: N/A;  730 -- with fluoro  . Skin cancer excision  2015    skin cancer removed from nose   Family History  Problem Relation Age of Onset  . Anesthesia problems Neg Hx   . Hypotension Neg Hx   . Malignant hyperthermia Neg Hx   . Pseudochol deficiency Neg Hx   . Hypertension Mother   . Diabetes Father    History  Substance Use Topics  . Smoking status: Current Every Day Smoker -- 0.50 packs/day for  43 years    Types: Cigarettes  . Smokeless tobacco: Never Used  . Alcohol Use: No   OB History    No data available     Review of Systems  Constitutional: Positive for fatigue. Negative for fever.  HENT: Positive for congestion and sore throat.   Eyes: Negative.   Respiratory: Positive for cough and wheezing. Negative for chest tightness and shortness of breath.   Cardiovascular: Negative for chest pain.  Gastrointestinal: Negative for nausea and abdominal pain.  Genitourinary: Negative.   Musculoskeletal: Negative for joint swelling, arthralgias and neck pain.  Skin: Negative.  Negative for rash and  wound.  Neurological: Negative for dizziness, weakness, light-headedness, numbness and headaches.  Psychiatric/Behavioral: Negative.       Allergies  Review of patient's allergies indicates no known allergies.  Home Medications   Prior to Admission medications   Medication Sig Start Date End Date Taking? Authorizing Provider  albuterol (PROVENTIL HFA;VENTOLIN HFA) 108 (90 BASE) MCG/ACT inhaler Inhale 1 puff into the lungs every 6 (six) hours as needed for wheezing or shortness of breath.   Yes Historical Provider, MD  albuterol (PROVENTIL) (2.5 MG/3ML) 0.083% nebulizer solution Take 2.5 mg by nebulization every 6 (six) hours as needed for wheezing or shortness of breath.   Yes Historical Provider, MD  Cholecalciferol (VITAMIN D) 2000 UNITS tablet Take 2,000 Units by mouth daily.     Yes Historical Provider, MD  cycloSPORINE (RESTASIS) 0.05 % ophthalmic emulsion Place 1 drop into both eyes 2 (two) times daily.    Yes Historical Provider, MD  fish oil-omega-3 fatty acids 1000 MG capsule Take 1 g by mouth daily.    Yes Historical Provider, MD  meloxicam (MOBIC) 15 MG tablet Take 15 mg by mouth daily.    Yes Historical Provider, MD  potassium chloride SA (K-DUR,KLOR-CON) 20 MEQ tablet Take 1 tablet (20 mEq total) by mouth daily. 09/21/13  Yes Manon Hilding Kefalas, PA-C  tiotropium (SPIRIVA) 18 MCG inhalation capsule Place 18 mcg into inhaler and inhale daily.   Yes Historical Provider, MD  doxycycline (VIBRAMYCIN) 100 MG capsule Take 1 capsule (100 mg total) by mouth 2 (two) times daily. 03/25/14   Evalee Jefferson, PA-C  HYDROcodone-acetaminophen (NORCO/VICODIN) 5-325 MG per tablet Take 1 tablet by mouth every 4 (four) hours as needed. 03/25/14   Evalee Jefferson, PA-C   BP 121/60 mmHg  Pulse 77  Temp(Src) 98.3 F (36.8 C) (Oral)  Resp 18  Ht 5\' 7"  (1.702 m)  Wt 127 lb (57.607 kg)  BMI 19.89 kg/m2  SpO2 95% Physical Exam  Constitutional: She appears well-developed and well-nourished. No distress.  HENT:   Head: Normocephalic and atraumatic.  Eyes: Conjunctivae are normal.  Neck: Normal range of motion.  Cardiovascular: Normal rate, regular rhythm, normal heart sounds and intact distal pulses.   Pulmonary/Chest: Effort normal. She has wheezes. She has rhonchi in the left middle field and the left lower field.  Occasional expiratory wheeze.  Abdominal: Soft. Bowel sounds are normal. There is no tenderness.  Musculoskeletal: Normal range of motion.  Neurological: She is alert.  Skin: Skin is warm and dry.  Psychiatric: She has a normal mood and affect.  Nursing note and vitals reviewed.   ED Course  Procedures (including critical care time) Labs Review Labs Reviewed  CBC WITH DIFFERENTIAL - Abnormal; Notable for the following:    RBC 3.64 (*)    Hemoglobin 11.0 (*)    HCT 33.2 (*)    All other components within  normal limits  BASIC METABOLIC PANEL - Abnormal; Notable for the following:    Potassium 2.8 (*)    All other components within normal limits  TROPONIN I    Imaging Review Dg Chest 2 View  03/25/2014   CLINICAL DATA:  Productive cough with yellow sputum shortness of breath chest pain body aches low-grade fever, personal history of asthma skin cancer colon cancer and COPD  EXAM: CHEST  2 VIEW  COMPARISON:  12 x 15  FINDINGS: Hyperinflation consistent with COPD. The heart size and vascular pattern are normal. No pleural effusion or pneumothorax.  Mild patchy infiltrate in the lingula. Previously there was atelectasis in the inferior lingula. This could again represent atelectasis although appears somewhat fluffy and developing infiltrate is suspected.  IMPRESSION: Infiltrate in the anterior inferior lingula concerning for pneumonia.   Electronically Signed   By: Skipper Cliche M.D.   On: 03/25/2014 20:42     EKG Interpretation None      MDM   Final diagnoses:  Community acquired pneumonia  Hypokalemia    Patients labs and/or radiological studies were viewed and  considered during the medical decision making and disposition process. Pt was given rocephin IM.  States the little "pink antibiotic pill makes her nauseated ? Zithromax.  Doxycycline bid x 10 days.  Advised increase inhaler/nebs to q 4 hours prn cough or wheezing, rest, increased fluid intake.  Recheck by pcp or return here for worsened sx.  Potassium supplement given 40 meq.  Advised to increase her home potassium to bid for the next 5 days.  Pt discussed with Dr. Lacinda Axon prior to dc home.  The patient appears reasonably screened and/or stabilized for discharge and I doubt any other medical condition or other [facility] requiring further screening, evaluation, or treatment in the ED at this time prior to discharge.     Evalee Jefferson, PA-C 03/27/14 [place], MD 03/27/14 1719

## 2014-04-03 ENCOUNTER — Other Ambulatory Visit (HOSPITAL_COMMUNITY): Payer: Self-pay | Admitting: Family Medicine

## 2014-04-03 DIAGNOSIS — Z1231 Encounter for screening mammogram for malignant neoplasm of breast: Secondary | ICD-10-CM

## 2014-04-09 ENCOUNTER — Ambulatory Visit (HOSPITAL_COMMUNITY)
Admission: RE | Admit: 2014-04-09 | Discharge: 2014-04-09 | Disposition: A | Discharge status: Home / Self Care | Payer: Medicaid Other | Source: Ambulatory Visit | Attending: Family Medicine | Admitting: Family Medicine

## 2014-04-09 DIAGNOSIS — Z1231 Encounter for screening mammogram for malignant neoplasm of breast: Secondary | ICD-10-CM | POA: Insufficient documentation

## 2014-04-09 DIAGNOSIS — R928 Other abnormal and inconclusive findings on diagnostic imaging of breast: Secondary | ICD-10-CM | POA: Insufficient documentation

## 2014-04-12 ENCOUNTER — Other Ambulatory Visit: Payer: Self-pay | Admitting: Family Medicine

## 2014-04-12 DIAGNOSIS — R928 Other abnormal and inconclusive findings on diagnostic imaging of breast: Secondary | ICD-10-CM

## 2014-04-24 ENCOUNTER — Other Ambulatory Visit: Payer: Self-pay | Admitting: Family Medicine

## 2014-04-24 ENCOUNTER — Ambulatory Visit (HOSPITAL_COMMUNITY)
Admission: RE | Admit: 2014-04-24 | Discharge: 2014-04-24 | Disposition: A | Discharge status: Home / Self Care | Payer: Medicaid Other | Source: Ambulatory Visit | Attending: Family Medicine | Admitting: Family Medicine

## 2014-04-24 DIAGNOSIS — R928 Other abnormal and inconclusive findings on diagnostic imaging of breast: Secondary | ICD-10-CM | POA: Insufficient documentation

## 2014-04-24 DIAGNOSIS — N63 Unspecified lump in unspecified breast: Secondary | ICD-10-CM

## 2014-05-08 ENCOUNTER — Ambulatory Visit (HOSPITAL_COMMUNITY)
Admission: RE | Admit: 2014-05-08 | Discharge: 2014-05-08 | Disposition: A | Discharge status: Home / Self Care | Payer: Medicaid Other | Source: Ambulatory Visit | Attending: Family Medicine | Admitting: Family Medicine

## 2014-05-08 ENCOUNTER — Encounter (HOSPITAL_COMMUNITY): Payer: Self-pay

## 2014-05-08 ENCOUNTER — Other Ambulatory Visit (HOSPITAL_COMMUNITY): Payer: Self-pay | Admitting: Family Medicine

## 2014-05-08 ENCOUNTER — Other Ambulatory Visit (HOSPITAL_COMMUNITY): Payer: Medicaid Other

## 2014-05-08 DIAGNOSIS — N6310 Unspecified lump in the right breast, unspecified quadrant: Secondary | ICD-10-CM

## 2014-05-08 DIAGNOSIS — N63 Unspecified lump in unspecified breast: Secondary | ICD-10-CM

## 2014-05-08 DIAGNOSIS — R928 Other abnormal and inconclusive findings on diagnostic imaging of breast: Secondary | ICD-10-CM

## 2014-05-08 MED ORDER — LIDOCAINE HCL (PF) 2 % IJ SOLN
10.0000 mL | Freq: Once | INTRAMUSCULAR | Status: AC
  Administered 2014-05-08: 10 mL

## 2014-05-08 MED ORDER — LIDOCAINE HCL (PF) 2 % IJ SOLN
INTRAMUSCULAR | Status: AC
  Administered 2014-05-08: 10 mL
  Filled 2014-05-08: qty 10

## 2014-05-08 NOTE — Discharge Instructions (Signed)
Breast Biopsy A breast biopsy is a test during which a sample of tissue is taken from your breast. The breast tissue is looked at under a microscope for cancer cells.  BEFORE THE PROCEDURE  Make plans to have someone drive you home after the test.  Do not smoke for 2 weeks before the test. Stop smoking, if you smoke.  Do not drink alcohol for 24 hours before the test.  Wear a good support bra to the test. PROCEDURE  You may be given one of the following:  A medicine to numb the breast area (local anesthetic).  A medicine to make you fall asleep (general anesthetic). There are different types of breast biopsies. They include:  Fine-needle aspiration.  A needle is put into the breast lump.  The needle takes out fluid and cells from the lump.  Ultrasound imaging may be used to help find the lump and to put the needle in the right spot.  Core-needle biopsy.  A needle is put into the breast lump.  The needle is put in your breast 3-6 times.  The needle removes breast tissue.  An ultrasound image or X-ray is often used to find the right spot to put in the needle.  Stereotactic biopsy.  X-rays and a computer are used to study X-ray pictures of the breast lump.  The computer finds where the needle needs to be put into the breast.  Tissue samples are taken out.  Vacuum-assisted biopsy.  A small cut (incision) is made in your breast.  A biopsy device is put through the cut and into the breast tissue.  The biopsy device draws abnormal breast tissue into the biopsy device.  A large tissue sample is often removed.  No stitches are needed.  Ultrasound-guided core-needle biopsy.  Ultrasound imaging helps guide the needle into the area of the breast that is not normal.  A cut is made in the breast. The needle is put into the breast lump.  Tissue samples are taken out.  Open biopsy.  A large cut is made in the breast.  Your doctor will try to remove the whole  breast lump or as much as possible. All tissue, fluid, or cell samples are looked at under a microscope.  AFTER THE PROCEDURE  You will be taken to an area to recover. You will be able to go home once you are doing well and are without problems.  You may have bruising on your breast. This is normal.  A pressure bandage (dressing) may be put on your breast for 24-48 hours. This type of bandage is wrapped tightly around your chest. It helps stop fluid from building up underneath tissues. Document Released: 05/25/2011 Document Revised: 07/17/2013 Document Reviewed: 05/25/2011 [facility] Patient Information 2015 [place], [place]. This information is not intended to replace advice given to you by your health care provider. Make sure you discuss any questions you have with your health care provider. Breast Biopsy Care After These instructions give you information on caring for yourself after your procedure. Your doctor may also give you more specific instructions. Call your doctor if you have any problems or questions after your procedure. HOME CARE  Only take medicine as told by your doctor.  Do not take aspirin.  Keep your sutures (stitches) dry when bathing.  Protect the biopsy area. Do not let the area get bumped.  Avoid activities that could pull the biopsy site open until your doctor approves. This includes:  Stretching.  Reaching.  Exercise.  Sports.  Lifting more than 3lb.  Continue your normal diet.  Wear a good support bra for as long as told by your doctor.  Change any bandages (dressings) as told by your doctor.  Do not drink alcohol while taking pain medicine.  Keep all doctor visits as told. Ask when your test results will be ready. Make sure you get your test results. GET HELP RIGHT AWAY IF:   You have a fever.  You have more bleeding (more than a small spot) from the biopsy site.  You have trouble breathing.  You have yellowish-white fluid (pus) coming from  the biopsy site.  You have redness, puffiness (swelling), or more pain in the biopsy site.  You have a bad smell coming from the biopsy site.  Your biopsy site opens after sutures, staples, or sticky strips have been removed.  You have a rash.  You need stronger medicine. MAKE SURE YOU:  Understand these instructions.  Will watch your condition.  Will get help right away if you are not doing well or get worse. Document Released: 12/27/2008 Document Revised: 05/25/2011 Document Reviewed: 04/12/2011 [facility] Patient Information 2015 [place], [place]. This information is not intended to replace advice given to you by your health care provider. Make sure you discuss any questions you have with your health care provider.

## 2014-08-21 ENCOUNTER — Encounter (HOSPITAL_COMMUNITY): Payer: Medicaid Other | Attending: Hematology & Oncology

## 2014-08-21 ENCOUNTER — Other Ambulatory Visit (HOSPITAL_COMMUNITY): Payer: Self-pay | Admitting: Oncology

## 2014-08-21 DIAGNOSIS — C189 Malignant neoplasm of colon, unspecified: Secondary | ICD-10-CM | POA: Diagnosis present

## 2014-08-21 DIAGNOSIS — E876 Hypokalemia: Secondary | ICD-10-CM

## 2014-08-21 DIAGNOSIS — C182 Malignant neoplasm of ascending colon: Secondary | ICD-10-CM

## 2014-08-21 LAB — COMPREHENSIVE METABOLIC PANEL
ALT: 13 U/L — ABNORMAL LOW (ref 14–54)
AST: 18 U/L (ref 15–41)
Albumin: 3.9 g/dL (ref 3.5–5.0)
Alkaline Phosphatase: 77 U/L (ref 38–126)
Anion gap: 9 (ref 5–15)
BUN: 8 mg/dL (ref 6–20)
CALCIUM: 8.7 mg/dL — AB (ref 8.9–10.3)
CO2: 28 mmol/L (ref 22–32)
Chloride: 102 mmol/L (ref 101–111)
Creatinine, Ser: 0.76 mg/dL (ref 0.44–1.00)
GFR calc Af Amer: >60 mL/min (ref >60)
GFR calc non Af Amer: >60 mL/min (ref >60)
Glucose, Bld: 78 mg/dL (ref 65–99)
POTASSIUM: 2.7 mmol/L — AB (ref 3.5–5.1)
SODIUM: 139 mmol/L (ref 135–145)
Total Bilirubin: 0.5 mg/dL (ref 0.3–1.2)
Total Protein: 6.8 g/dL (ref 6.5–8.1)

## 2014-08-21 LAB — CBC WITH DIFFERENTIAL/PLATELET
Basophils Absolute: 0 10*3/uL (ref 0.0–0.1)
Basophils Relative: 0 % (ref 0–1)
EOS PCT: 3 % (ref 0–5)
Eosinophils Absolute: 0.2 10*3/uL (ref 0.0–0.7)
HCT: 35.3 % — ABNORMAL LOW (ref 36.0–46.0)
Hemoglobin: 11.8 g/dL — ABNORMAL LOW (ref 12.0–15.0)
Lymphocytes Relative: 44 % (ref 12–46)
Lymphs Abs: 3 10*3/uL (ref 0.7–4.0)
MCH: 30.6 pg (ref 26.0–34.0)
MCHC: 33.4 g/dL (ref 30.0–36.0)
MCV: 91.7 fL (ref 78.0–100.0)
MONO ABS: 0.5 10*3/uL (ref 0.1–1.0)
Monocytes Relative: 7 % (ref 3–12)
Neutro Abs: 3.1 10*3/uL (ref 1.7–7.7)
Neutrophils Relative %: 46 % (ref 43–77)
Platelets: 213 10*3/uL (ref 150–400)
RBC: 3.85 MIL/uL — AB (ref 3.87–5.11)
RDW: 14.4 % (ref 11.5–15.5)
WBC: 6.8 10*3/uL (ref 4.0–10.5)

## 2014-08-21 LAB — FERRITIN: Ferritin: 50 ng/mL (ref 11–307)

## 2014-08-21 MED ORDER — POTASSIUM CHLORIDE CRYS ER 20 MEQ PO TBCR: 20.0000 meq | EXTENDED_RELEASE_TABLET | Freq: Three times a day (TID) | ORAL | Status: DC

## 2014-08-21 NOTE — Patient Instructions (Signed)
CRITICAL VALUE ALERT Critical value received:  Potassium 2.7 Date of notification:  08/21/14  Time of notification: 1119  Critical value read back:  Yes.   Nurse who received alert:  A.Ouida Sills, RN MD notified:  Caroleen Hamman, PA at [phone]

## 2014-08-21 NOTE — Progress Notes (Signed)
Labs drawn

## 2014-08-22 ENCOUNTER — Other Ambulatory Visit (HOSPITAL_COMMUNITY): Payer: Self-pay | Admitting: Oncology

## 2014-08-22 DIAGNOSIS — E611 Iron deficiency: Secondary | ICD-10-CM

## 2014-08-22 LAB — CEA: CEA: 6.1 ng/mL — AB (ref 0.0–4.7)

## 2014-08-22 MED ORDER — IRON POLYSACCH CMPLX-B12-FA 150-0.025-1 MG PO CAPS: 1.0000 | ORAL_CAPSULE | Freq: Every day | ORAL | Status: AC

## 2014-08-23 ENCOUNTER — Ambulatory Visit (HOSPITAL_COMMUNITY): Payer: Medicaid Other | Admitting: Hematology & Oncology

## 2014-08-23 ENCOUNTER — Encounter (HOSPITAL_COMMUNITY): Payer: Self-pay | Admitting: Hematology & Oncology

## 2014-08-23 ENCOUNTER — Encounter (HOSPITAL_BASED_OUTPATIENT_CLINIC_OR_DEPARTMENT_OTHER): Payer: Medicaid Other | Admitting: Hematology & Oncology

## 2014-08-23 VITALS — BP 112/41 | HR 61 | Temp 98.3°F | Resp 18 | Wt 120.2 lb

## 2014-08-23 DIAGNOSIS — D509 Iron deficiency anemia, unspecified: Secondary | ICD-10-CM

## 2014-08-23 DIAGNOSIS — C189 Malignant neoplasm of colon, unspecified: Secondary | ICD-10-CM

## 2014-08-23 DIAGNOSIS — E876 Hypokalemia: Secondary | ICD-10-CM

## 2014-08-23 DIAGNOSIS — Z720 Tobacco use: Secondary | ICD-10-CM | POA: Diagnosis not present

## 2014-08-23 NOTE — Progress Notes (Signed)
April Wade, [place] [address]  Colonoscopy 08/24/2013 CT C/A/P 02/2014  CURRENT THERAPY: Surveillance per NCCN guidelines  INTERVAL HISTORY: April Wade 59 y.o. 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 Resection margins were clear and she also had associated appendectomy which was negative for pathology. She had resection on 11/21/2010.   She is alone today and complains of knots; one on left, back side of neck (noticed yesterday) and one near the top left shoulder (noticed a while ago, she believe she has been told it is a fatty tumor).  They don't hurt.  Frequent diarrhea; she notes this since her surgery but thinks it may be slightly worse. She has not changed her eating habits.  Hobbies include crocheting and needle point.  Mammograms yearly, Dr. Hilma Favors.  Last colonoscopy with Dr. Laural Golden February 2016, 3 year recall Has been smoking for 42 years, half a ppd. She feels it is too difficult to quit.   She was just started on potassium and iron by our office for low potassium and moderately low iron levels. She states she is taking them daily with no difficulty.  Past Medical History  Diagnosis Date  . Headache, migraine   . Borderline diabetic   . Skin cancer of face     colon dx 10/2010  . COPD (chronic obstructive pulmonary disease)   . Shortness of breath   . Asthma   . Adenocarcinoma     abdominal wall  . Arthritis   . Dental caries   . 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 Resection margins were clear and she also had associated appendectomy which was negative for  pathology. She had resection on 11/21/2010.     has Adenocarcinoma of colon on her problem list.     has No Known Allergies.  April Wade does not currently have medications on file.  Past Surgical History  Procedure Laterality Date  . Skin cancer excision      [place]  . Colonoscopy  10/29/2010    Procedure: COLONOSCOPY;  Surgeon: Rogene Houston, MD;  [facility];  Service: Endoscopy;  Laterality: N/A;  . Colostomy revision  11/21/2010    Procedure: COLON RESECTION RIGHT;  Surgeon: Scherry Ran;  Location: [facility];  Service: General;  Laterality: Right;  Right Colectomy, Frozen section.  Pathology notified 11/20/2010  . Mass excision  08/03/2011    Procedure: EXCISION MASS;  Surgeon: Scherry Ran, MD;  Location: [facility];  Service: General;  Laterality: N/A;  Abdominal Wall Mass with repair of abdominal wall defect with proceed mesh  . Abdominal wall defect repair  08/03/2011    Procedure: REPAIR ABDOMINAL WALL;  Surgeon: Scherry Ran, MD;  Location: [facility];  Service: General;  Laterality: N/A;  . Colonoscopy N/A 02/15/2013    Procedure: COLONOSCOPY;  Surgeon: Rogene Houston, MD;  [facility];  Service: Endoscopy;  Laterality: N/A;  1030-moved to 1120 Ann to notify pt  . Colon surgery    . Colonoscopy N/A 09/27/2013    Procedure: COLONOSCOPY;  Surgeon: Rogene Houston, MD;  [facility];  Service:  Endoscopy;  Laterality: N/A;  730 -- with fluoro  . Skin cancer excision  2015    skin cancer removed from nose    Denies any headaches, dizziness, double vision, fevers, chills, night sweats, nausea, vomiting, constipation, chest pain, heart palpitations, shortness of breath, blood in stool, black tarry stool, urinary pain, urinary burning, urinary frequency, hematuria. Positive for diarrhea "all the time"    PHYSICAL EXAMINATION  ECOG PERFORMANCE STATUS: 1 - Symptomatic but completely ambulatory  Filed Vitals:   08/23/14 1253  BP: 112/41    Pulse: 61  Temp: 98.3 F (36.8 C)  Resp: 18    GENERAL:alert, no distress, well nourished, well developed, comfortable, cooperative and smiling SKIN: skin color, texture, turgor are normal, no rashes or significant lesions HEAD: Normocephalic EYES: normal, PERRLA, EOMI, Conjunctiva are pink and non-injected EARS: External ears normal OROPHARYNX:mucous membranes are moist  NECK: supple, no adenopathy, thyroid normal size, non-tender, without nodularity, no stridor, non-tender, trachea midline. Small palpable mobile LN left posterior neck, not pathologic LYMPH:  no palpable lymphadenopathy, no hepatosplenomegaly BREAST:not examined LUNGS: clear to auscultation and percussion HEART: regular rate & rhythm, no murmurs, no gallops, S1 normal and S2 normal ABDOMEN:abdomen soft, non-tender, normal bowel sounds, no masses or organomegaly, and no hepatosplenomegaly BACK: Back symmetric, no curvature., No CVA tenderness EXTREMITIES:less then 2 second capillary refill, no joint deformities, effusion, or inflammation, no edema, no skin discoloration  NEURO: alert & oriented x 3 with fluent speech, no focal motor/sensory deficits, gait normal   LABORATORY DATA: Lab Results  Component Value Date   CEA 6.1* 08/21/2014     RADIOGRAPHIC STUDIES:   ASSESSMENT:  1. 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 Resection margins were clear and she also had associated appendectomy which was negative for pathology. She had resection on 11/21/2010.  2. Abdominal wall mass, s/p excision by Dr. Romona Curls on 08/03/2011, negative for malignancy.  3. Tobacco Abuse 4. Hypokalemia 5. Iron deficiency   THERAPY PLAN:   She has been a chronic smoker but is had normal CEA levels. It is somewhat concerning that her CEA has risen and I discussed this with her today. I would  recommend proceeding with CT imaging of the abdomen and pelvis given her history of colorectal cancer. She has chronic diarrhea and it is hard to gauge if this is worse or not. I instructed her on Imodium use. She is up-to-date on colonoscopy. Her diarrhea maybe an explanation for her hypokalemia. She was instructed to continue with her oral potassium with a repeat potassium next week. She is to continue with her oral iron supplementation as well. She was instructed that should she develop abdominal discomfort on oral iron to let us know. We will monitor her iron levels moving forward. I will contact her with the results of CT imaging. There is anything of concern she was advised she will be brought in for an appointment.  We discussed smoking cessation. I offered her multiple options to discontinue including classes, medications. She currently feels it is too difficult to stop. We discussed the benefits of smoking cessation.   We will continue to follow NCCN guidelines for surveillance: NCCN guidelines for Stage II/III Colon Cancer are as follows:  1. H+P every 3-6 months for 2 years and then every 6 months for a total of 5 years.  2. CEA every 3-6 months for 2 years and  then every 6 months for a total of 5 years (completing in fall 2017)  3. Colonoscopy at 1 year and then as indicated (except if no preoperative colonoscopy due to obstructing lesion, colonoscopy in 3-6 months)   A. If advanced adenoma, then repeat in 1 year   B. If no advanced adenoma, repeat in 3 years, then every 5 years.   4. CT CAP annually for up to 5 years for patients at high risk for recurrence   All questions were answered. The patient knows to call the clinic with any problems, questions or concerns. We can certainly see the patient much sooner if necessary.  This document serves as a record of services personally performed by Ancil Linsey, MD. It was created on her behalf by Janace Hoard, a trained medical scribe.  The creation of this record is based on the scribe's personal observations and the provider's statements to them. This document has been checked and approved by the attending provider.    I have reviewed the above documentation for accuracy and completeness and I agree with the above. This note was electronically signed.  Molli Hazard MD

## 2014-08-23 NOTE — Patient Instructions (Signed)
..  [place] at [facility] Discharge Instructions  RECOMMENDATIONS MADE BY THE CONSULTANT AND ANY TEST RESULTS WILL BE SENT TO YOUR REFERRING PHYSICIAN. Return to the clinic for follow up appointment in three months as scheduled, please see your schedule.  You are to have a CT scan next week, please see schedule.  Please follow the written instructions pertaining to your CT scan.    Thank you for choosing [place] at [facility] to provide your oncology and hematology care.  To afford each patient quality time with our provider, please arrive at least 15 minutes before your scheduled appointment time.    You need to re-schedule your appointment should you arrive 10 or more minutes late.  We strive to give you quality time with our providers, and arriving late affects you and other patients whose appointments are after yours.  Also, if you no show three or more times for appointments you may be dismissed from the clinic at the providers discretion.     Again, thank you for choosing [facility].  Our hope is that these requests will decrease the amount of time that you wait before being seen by our physicians.       _____________________________________________________________  Should you have questions after your visit to [facility], please contact our office at [phone] between the hours of 8:30 a.m. and 4:30 p.m.  Voicemails left after 4:30 p.m. will not be returned until the following business day.  For prescription refill requests, have your pharmacy contact our office.

## 2014-08-24 ENCOUNTER — Other Ambulatory Visit (HOSPITAL_COMMUNITY): Payer: Self-pay

## 2014-08-24 DIAGNOSIS — C189 Malignant neoplasm of colon, unspecified: Secondary | ICD-10-CM

## 2014-08-30 ENCOUNTER — Encounter (HOSPITAL_BASED_OUTPATIENT_CLINIC_OR_DEPARTMENT_OTHER): Payer: Medicaid Other

## 2014-08-30 ENCOUNTER — Ambulatory Visit (HOSPITAL_COMMUNITY)
Admission: RE | Admit: 2014-08-30 | Discharge: 2014-08-30 | Disposition: A | Discharge status: Home / Self Care | Payer: Medicaid Other | Source: Ambulatory Visit | Attending: Hematology & Oncology | Admitting: Hematology & Oncology

## 2014-08-30 DIAGNOSIS — Z85820 Personal history of malignant melanoma of skin: Secondary | ICD-10-CM | POA: Diagnosis not present

## 2014-08-30 DIAGNOSIS — C189 Malignant neoplasm of colon, unspecified: Secondary | ICD-10-CM | POA: Diagnosis not present

## 2014-08-30 DIAGNOSIS — E876 Hypokalemia: Secondary | ICD-10-CM | POA: Diagnosis present

## 2014-08-30 DIAGNOSIS — R970 Elevated carcinoembryonic antigen [CEA]: Secondary | ICD-10-CM | POA: Diagnosis not present

## 2014-08-30 LAB — POTASSIUM: Potassium: 4.4 mmol/L (ref 3.5–5.1)

## 2014-08-30 MED ORDER — IOHEXOL 300 MG/ML  SOLN
100.0000 mL | Freq: Once | INTRAMUSCULAR | Status: AC | PRN
  Administered 2014-08-30: 100 mL via INTRAVENOUS

## 2014-08-31 LAB — CEA: CEA: 6.7 ng/mL — ABNORMAL HIGH (ref 0.0–4.7)

## 2014-08-31 NOTE — Progress Notes (Signed)
Lab draw

## 2014-09-04 ENCOUNTER — Other Ambulatory Visit (HOSPITAL_COMMUNITY): Payer: Self-pay | Admitting: Oncology

## 2014-09-04 DIAGNOSIS — C189 Malignant neoplasm of colon, unspecified: Secondary | ICD-10-CM

## 2014-09-05 ENCOUNTER — Telehealth (HOSPITAL_COMMUNITY): Payer: Self-pay | Admitting: Emergency Medicine

## 2014-09-05 NOTE — Telephone Encounter (Signed)
Pt called to notify pt of CT scan results

## 2014-09-05 NOTE — Telephone Encounter (Signed)
-----   Message from [facility], PA-C sent at 09/04/2014  7:50 PM EDT ----- No evidence of recurrence.

## 2014-09-20 ENCOUNTER — Encounter (HOSPITAL_COMMUNITY): Payer: Medicaid Other | Attending: Hematology & Oncology

## 2014-09-20 DIAGNOSIS — C189 Malignant neoplasm of colon, unspecified: Secondary | ICD-10-CM | POA: Insufficient documentation

## 2014-09-20 NOTE — Progress Notes (Signed)
Labs drawn

## 2014-09-21 LAB — CEA: CEA: 7.5 ng/mL — AB (ref 0.0–4.7)

## 2014-11-23 ENCOUNTER — Encounter (HOSPITAL_COMMUNITY): Payer: Self-pay | Admitting: Hematology & Oncology

## 2014-11-23 ENCOUNTER — Encounter (HOSPITAL_BASED_OUTPATIENT_CLINIC_OR_DEPARTMENT_OTHER): Payer: Medicaid Other

## 2014-11-23 ENCOUNTER — Encounter (HOSPITAL_COMMUNITY): Payer: Medicaid Other | Attending: Hematology & Oncology | Admitting: Hematology & Oncology

## 2014-11-23 DIAGNOSIS — C189 Malignant neoplasm of colon, unspecified: Secondary | ICD-10-CM

## 2014-11-23 DIAGNOSIS — E876 Hypokalemia: Secondary | ICD-10-CM | POA: Diagnosis not present

## 2014-11-23 LAB — CBC WITH DIFFERENTIAL/PLATELET
BASOS ABS: 0 10*3/uL (ref 0.0–0.1)
BASOS PCT: 0 % (ref 0–1)
EOS PCT: 2 % (ref 0–5)
Eosinophils Absolute: 0.2 10*3/uL (ref 0.0–0.7)
HEMATOCRIT: 37.4 % (ref 36.0–46.0)
Hemoglobin: 12.5 g/dL (ref 12.0–15.0)
Lymphocytes Relative: 47 % — ABNORMAL HIGH (ref 12–46)
Lymphs Abs: 3.4 10*3/uL (ref 0.7–4.0)
MCH: 31.3 pg (ref 26.0–34.0)
MCHC: 33.4 g/dL (ref 30.0–36.0)
MCV: 93.5 fL (ref 78.0–100.0)
MONOS PCT: 6 % (ref 3–12)
Monocytes Absolute: 0.4 10*3/uL (ref 0.1–1.0)
Neutro Abs: 3.2 10*3/uL (ref 1.7–7.7)
Neutrophils Relative %: 45 % (ref 43–77)
PLATELETS: 231 10*3/uL (ref 150–400)
RBC: 4 MIL/uL (ref 3.87–5.11)
RDW: 14.8 % (ref 11.5–15.5)
WBC: 7.2 10*3/uL (ref 4.0–10.5)

## 2014-11-23 LAB — COMPREHENSIVE METABOLIC PANEL
ALBUMIN: 4.1 g/dL (ref 3.5–5.0)
ALT: 17 U/L (ref 14–54)
ANION GAP: 6 (ref 5–15)
AST: 23 U/L (ref 15–41)
Alkaline Phosphatase: 78 U/L (ref 38–126)
BILIRUBIN TOTAL: 0.5 mg/dL (ref 0.3–1.2)
BUN: 11 mg/dL (ref 6–20)
CO2: 29 mmol/L (ref 22–32)
Calcium: 9 mg/dL (ref 8.9–10.3)
Chloride: 103 mmol/L (ref 101–111)
Creatinine, Ser: 0.73 mg/dL (ref 0.44–1.00)
GFR calc Af Amer: >60 mL/min (ref >60)
Glucose, Bld: 79 mg/dL (ref 65–99)
POTASSIUM: 3.6 mmol/L (ref 3.5–5.1)
Sodium: 138 mmol/L (ref 135–145)
TOTAL PROTEIN: 7.5 g/dL (ref 6.5–8.1)

## 2014-11-23 NOTE — Progress Notes (Signed)
LABS DRAWN

## 2014-11-23 NOTE — Progress Notes (Signed)
April Kilts, MD [address] [place] [address]  Colonoscopy 08/24/2013 CT C/A/P 02/2014  CURRENT THERAPY: Surveillance per NCCN guidelines  INTERVAL HISTORY: April Wade 59 y.o. 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 Resection margins were clear and she also had associated appendectomy which was negative for pathology. She had resection on 11/21/2010. She has had normal CEA levels and had a sudden elevation in the last several months. She had imaging studies in June that showed no findings suggestive of metastatic disease. She had segmental narrowing in the mid sigmoid colon that was felt to be due to contraction. Her last colonoscopy was in June 2015. She had several polyps but no other suspicious findings.  April Wade is here alone today. She states she's doing "great."   She's been eating well. She denies any new pain, but she states that she is not sleeping well. This is chronic. She has been having good bowels, and denies any blood or bowel changes. She states it "was green" about a month ago, but it's been fine lately.  April Wade is still smoking. She laments that it is hard to quit. She states that she is currently smoking half a pack a day.  She remarks that recently she's been playing with her 18-year-old grandbaby. She has also been gardening. She says she "stays busy all the time."  Past Medical History  Diagnosis Date  . Headache, migraine   . Borderline diabetic   . Skin cancer of face     colon dx 10/2010  . COPD (chronic obstructive pulmonary disease)   . Shortness of breath   . Asthma   . Adenocarcinoma     abdominal wall  . Arthritis   . Dental caries   . Adenocarcinoma of colon 12/16/2011    Stage IIA moderately differentiated adenocarcinoma of  the colon extending into pericolonic fatty tissue and muscularis propria but no evidence for LVI; 15 nodes were all negative. Resection margins were clear and she also had associated appendectomy which was negative for pathology. She had resection on 11/21/2010.     has Adenocarcinoma of colon on her problem list.     has No Known Allergies.  April Wade does not currently have medications on file.  Past Surgical History  Procedure Laterality Date  . Skin cancer excision      [place]  . Colonoscopy  10/29/2010    Procedure: COLONOSCOPY;  Surgeon: Rogene Houston, MD;  [facility];  Service: Endoscopy;  Laterality: N/A;  . Colostomy revision  11/21/2010    Procedure: COLON RESECTION RIGHT;  Surgeon: Scherry Ran;  Location: [facility];  Service: General;  Laterality: Right;  Right Colectomy, Frozen section.  Pathology notified 11/20/2010  . Mass excision  08/03/2011    Procedure: EXCISION MASS;  Surgeon: Scherry Ran, MD;  Location: [facility];  Service: General;  Laterality: N/A;  Abdominal Wall Mass with repair of abdominal wall defect with proceed mesh  . Abdominal wall defect repair  08/03/2011    Procedure: REPAIR ABDOMINAL WALL;  Surgeon: Scherry Ran, MD;  Location: [facility];  Service: General;  Laterality: N/A;  . Colonoscopy N/A 02/15/2013    Procedure: COLONOSCOPY;  Surgeon: Rogene Houston, MD;  [facility];  Service: Endoscopy;  Laterality: N/A;  1030-moved to 1120 Ann to notify pt  . Colon surgery    . Colonoscopy N/A 09/27/2013    Procedure: COLONOSCOPY;  Surgeon: Rogene Houston, MD;  [facility];  Service: Endoscopy;  Laterality: N/A;  730 -- with fluoro  . Skin cancer excision  2015    skin cancer removed from nose    Denies any headaches, dizziness, double vision, fevers, chills, night sweats, diarrhea, nausea, vomiting, constipation, chest pain, heart palpitations, shortness of breath, blood in stool, black tarry stool, urinary pain,  urinary burning, urinary frequency, hematuria. 14 point review of systems was performed and is negative except as detailed under history of present illness and above   PHYSICAL EXAMINATION  ECOG PERFORMANCE STATUS: 1 - Symptomatic but completely ambulatory  Filed Vitals:   11/23/14 1241  BP: 118/65  Pulse: 68  Temp: 98.4 F (36.9 C)  Resp: 16    GENERAL:alert, no distress, well nourished, well developed, comfortable, cooperative and smiling SKIN: skin color, texture, turgor are normal, no rashes or significant lesions HEAD: Normocephalic EYES: normal, PERRLA, EOMI, Conjunctiva are pink and non-injected EARS: External ears normal OROPHARYNX:mucous membranes are moist  NECK: supple, no adenopathy, thyroid normal size, non-tender, without nodularity, no stridor, non-tender, trachea midline. Small palpable mobile LN left posterior neck, not pathologic LYMPH:  no palpable lymphadenopathy, no hepatosplenomegaly BREAST:not examined LUNGS: clear to auscultation and percussion HEART: regular rate & rhythm, no murmurs, no gallops, S1 normal and S2 normal ABDOMEN:abdomen soft, non-tender, normal bowel sounds, no masses or organomegaly, and no hepatosplenomegaly BACK: Back symmetric, no curvature., No CVA tenderness EXTREMITIES:less then 2 second capillary refill, no joint deformities, effusion, or inflammation, no edema, no skin discoloration  NEURO: alert & oriented x 3 with fluent speech, no focal motor/sensory deficits, gait normal   LABORATORY DATA: Lab Results  Component Value Date   CEA 7.5* 09/20/2014     RADIOGRAPHIC STUDIES:   ASSESSMENT:  1. 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 Resection margins were clear and she also had associated appendectomy which was negative for pathology. She had resection on 11/21/2010.  2. Abdominal wall  mass, s/p excision by Dr. Romona Wade on 08/03/2011, negative for malignancy.  3. Tobacco Abuse 4. Hypokalemia 5. Iron deficiency   THERAPY PLAN:   She has been a chronic smoker but is had normal CEA levels. It is somewhat concerning that her CEA has risen and I have discussed this with her. I am uncertain of the significance.  I advised her that if her level remains elevated we may consider referring her back for a C-scope of repeat imaging.  There are certainly many conditions that can elevated CEA other than malignancy.   We discussed smoking cessation. I offered her multiple options to discontinue including classes, medications. She currently feels it is too difficult to stop. We discussed the benefits of smoking cessation.   We will continue to follow NCCN guidelines for surveillance: NCCN guidelines for Stage II/III Colon Cancer are as follows:  1. H+P every 3-6 months for 2 years and then every 6 months for a total of 5 years.  2. CEA every 3-6 months for 2 years and then every 6 months for a total of 5 years (completing in fall 2017)  3. Colonoscopy at 1 year and then as indicated (except if no preoperative colonoscopy due to obstructing lesion, colonoscopy in 3-6 months)  A. If advanced adenoma, then repeat in 1 year   B. If no advanced adenoma, repeat in 3 years, then every 5 years.   4. CT CAP annually for up to 5 years for patients at high risk for recurrence //   She is due in January for her mammogram.  We will follow-up with her in 6 months, sooner if needed.  All questions were answered. The patient knows to call the clinic with any problems, questions or concerns. We can certainly see the patient much sooner if necessary.  This document serves as a record of services personally performed by Ancil Linsey, MD. It was created on her behalf by Toni Amend, a trained medical scribe. The creation of this record is based on the scribe's personal observations and the  provider's statements to them. This document has been checked and approved by the attending provider.   I have reviewed the above documentation for accuracy and completeness and I agree with the above. This note was electronically signed.  Molli Hazard, MD

## 2014-11-23 NOTE — Patient Instructions (Signed)
[facility] at [facility] Discharge Instructions  RECOMMENDATIONS MADE BY THE CONSULTANT AND ANY TEST RESULTS WILL BE SENT TO YOUR REFERRING PHYSICIAN.  Return to clinic in 6 months for follow up. Report any issues/concerns to clinic as needed prior to appointment.  Thank you for choosing [facility] at [facility] to provide your oncology and hematology care.  To afford each patient quality time with our provider, please arrive at least 15 minutes before your scheduled appointment time.    You need to re-schedule your appointment should you arrive 10 or more minutes late.  We strive to give you quality time with our providers, and arriving late affects you and other patients whose appointments are after yours.  Also, if you no show three or more times for appointments you may be dismissed from the clinic at the providers discretion.     Again, thank you for choosing [facility].  Our hope is that these requests will decrease the amount of time that you wait before being seen by our physicians.       _____________________________________________________________  Should you have questions after your visit to [facility], please contact our office at [phone] between the hours of 8:30 a.m. and 4:30 p.m.  Voicemails left after 4:30 p.m. will not be returned until the following business day.  For prescription refill requests, have your pharmacy contact our office.    

## 2014-11-24 LAB — CEA: CEA: 8.2 ng/mL — AB (ref 0.0–4.7)

## 2014-12-05 IMAGING — CR DG CHEST 2V
3 series · 3 of 3 positions shown · non-contrast
Comparison: Chest CT dated 12/22/2010

CLINICAL DATA: Cough. COPD.

EXAM:
CHEST  2 VIEW

[view not recorded (1 of 3)]
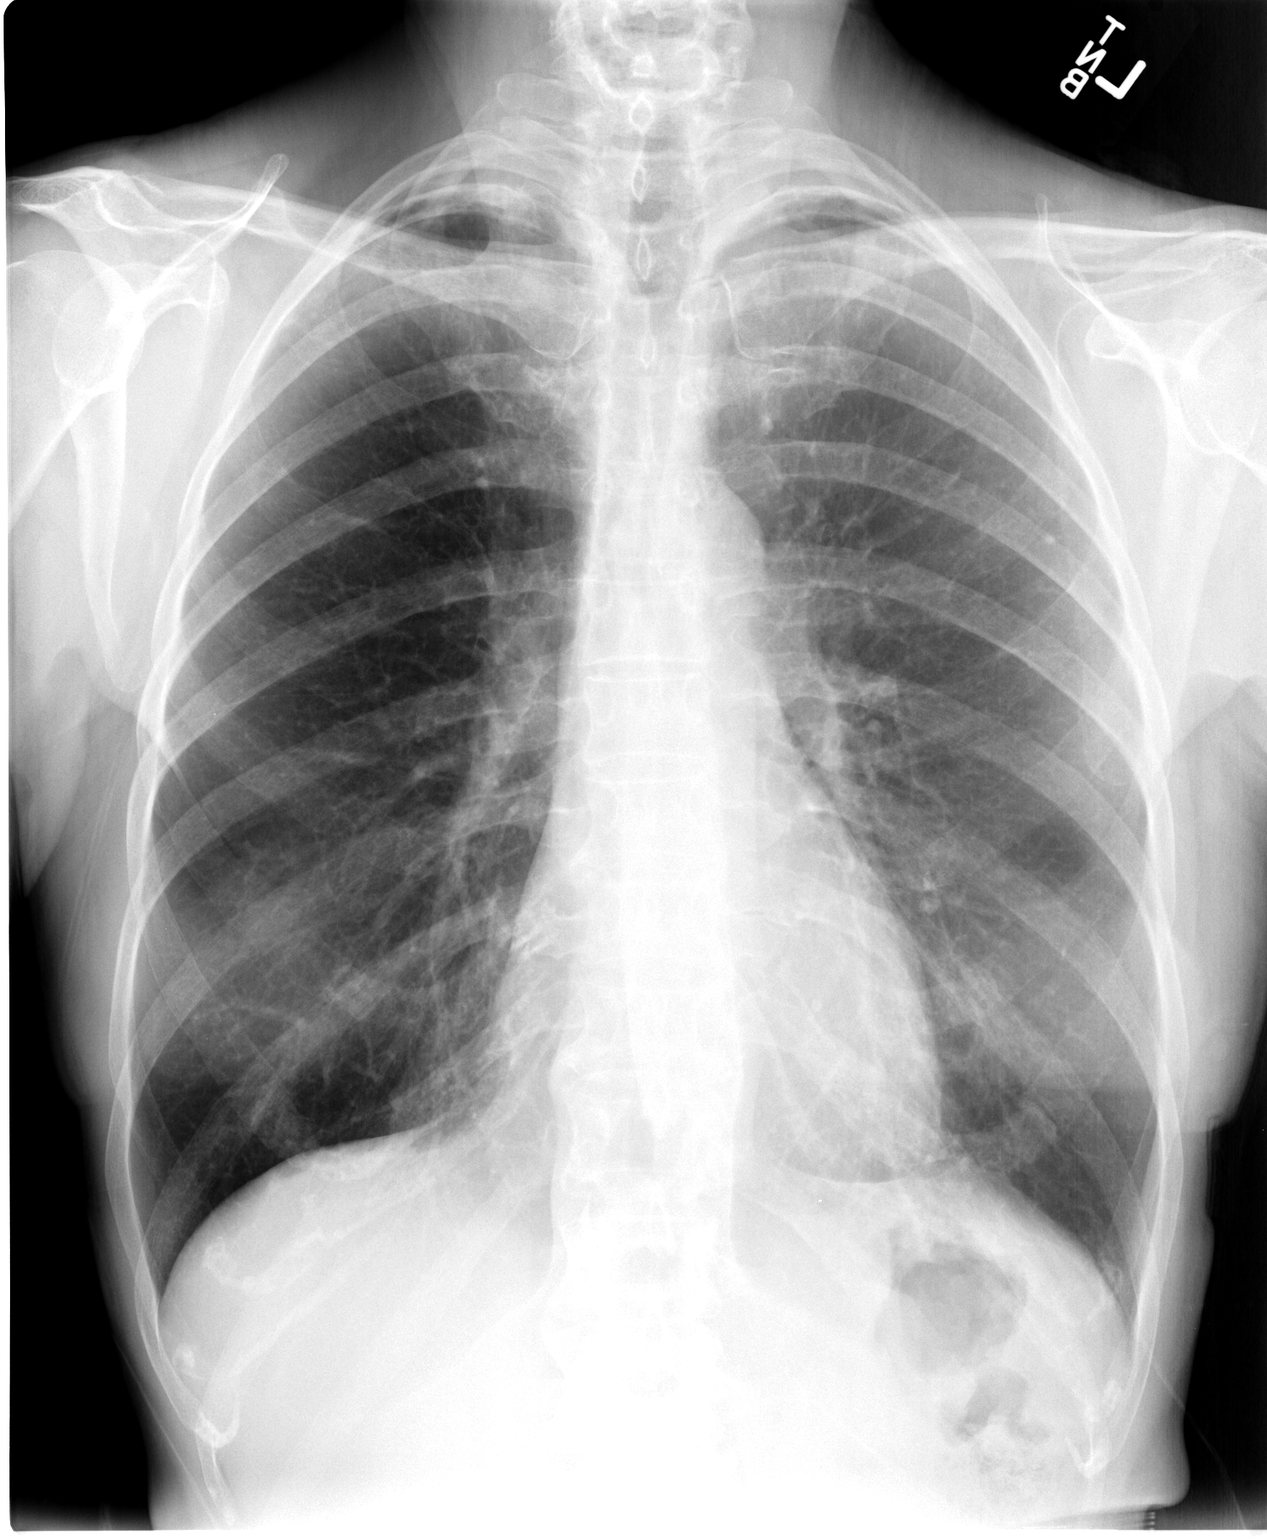

[view not recorded (2 of 3)]
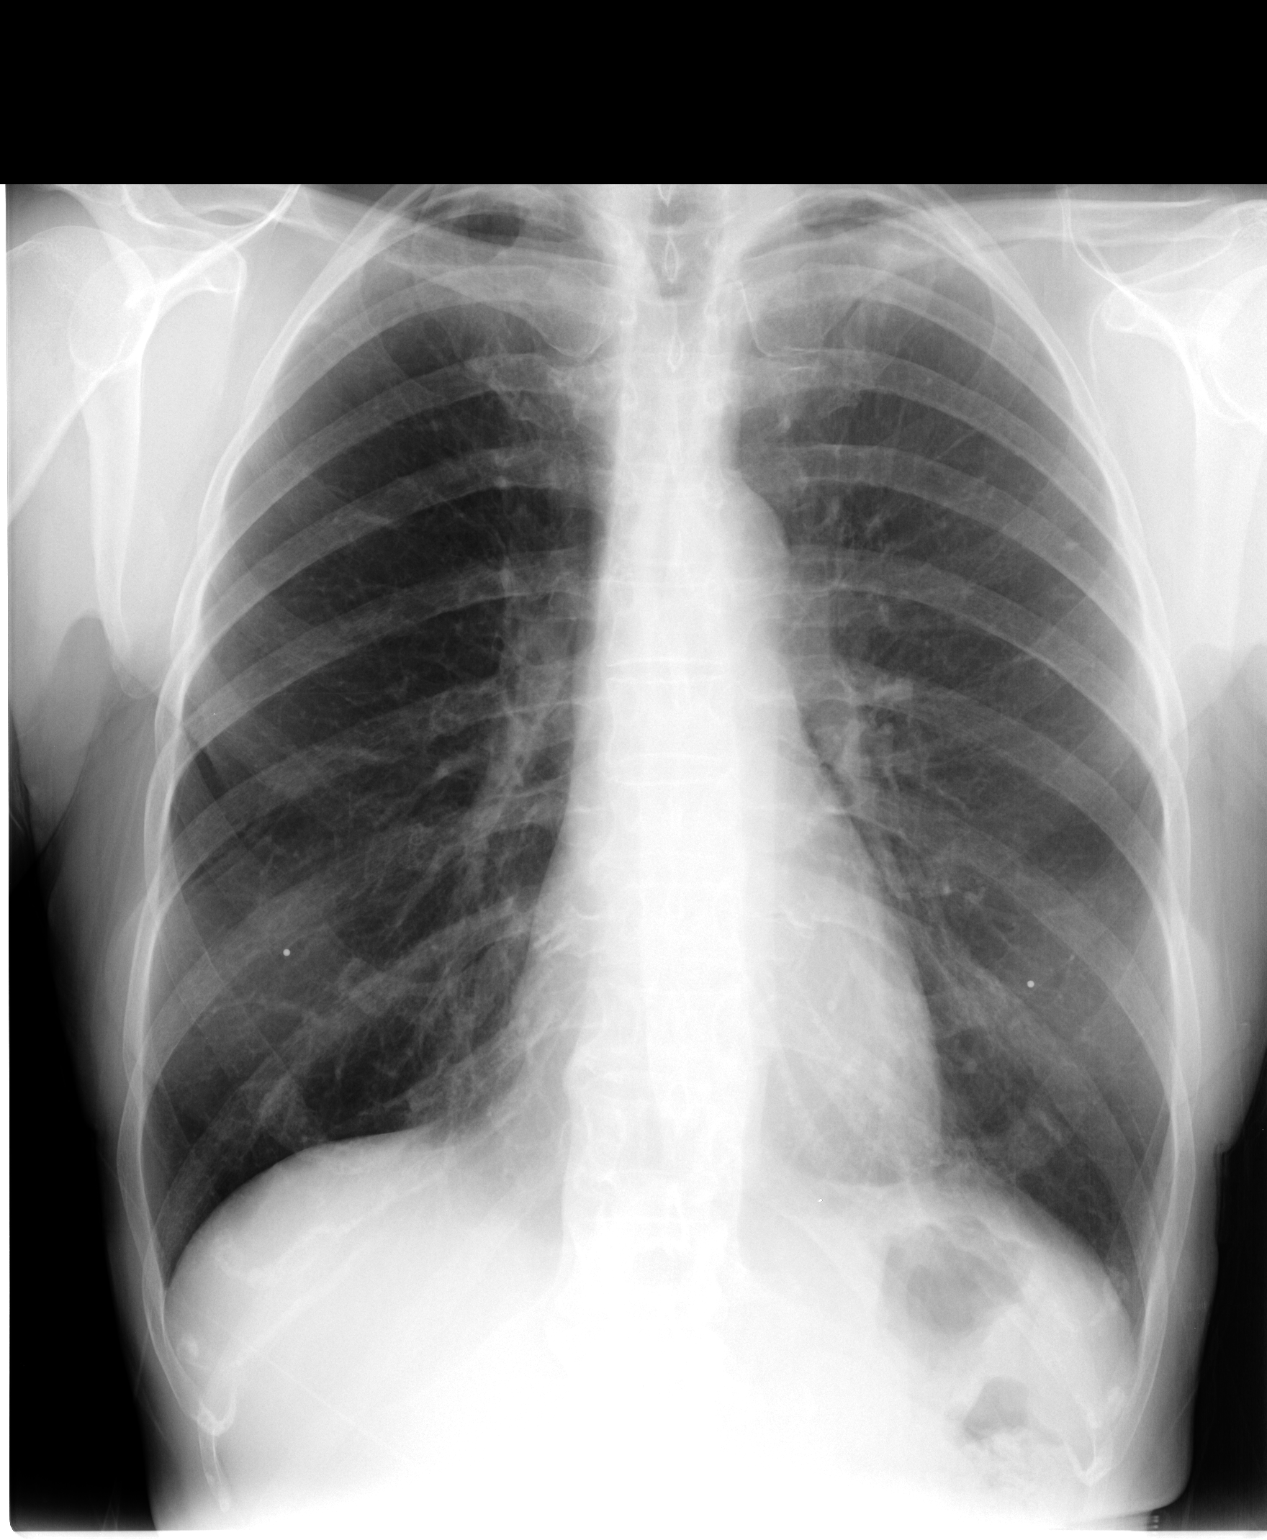

[view not recorded (3 of 3)]
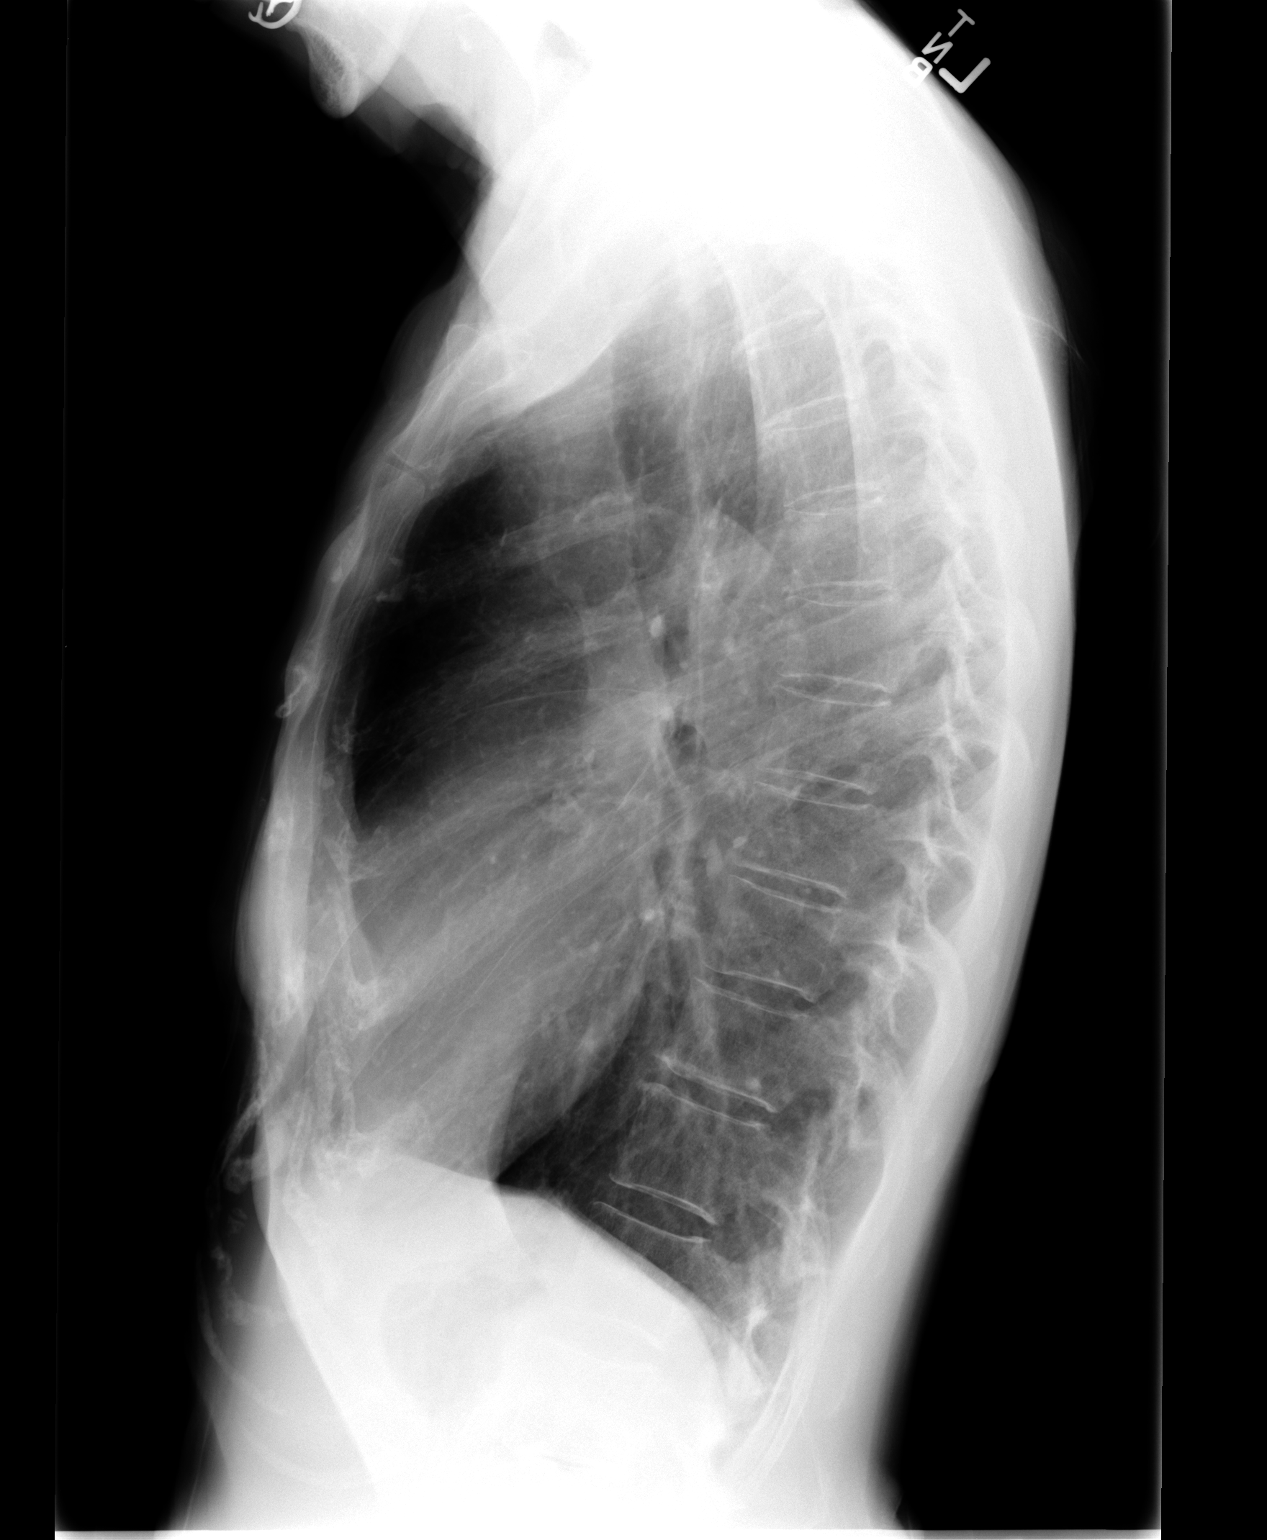

[3 of 3 positions shown; findings below may reference images not displayed]

FINDINGS: Heart size and pulmonary vascularity are normal. The lungs are
hyperinflated consistent with emphysema. Tiny calcified granuloma in
the left upper lobe. Minimal scarring at the lung apices. No osseous
abnormality.
IMPRESSION: No acute disease. Emphysema.

## 2015-01-18 ENCOUNTER — Other Ambulatory Visit (HOSPITAL_COMMUNITY): Payer: Self-pay | Admitting: Hematology & Oncology

## 2015-01-18 DIAGNOSIS — C189 Malignant neoplasm of colon, unspecified: Secondary | ICD-10-CM

## 2015-02-20 ENCOUNTER — Ambulatory Visit (HOSPITAL_COMMUNITY): Payer: Medicaid Other

## 2015-02-21 ENCOUNTER — Ambulatory Visit (HOSPITAL_COMMUNITY)
Admission: RE | Admit: 2015-02-21 | Discharge: 2015-02-21 | Disposition: A | Discharge status: Home / Self Care | Payer: Medicaid Other | Source: Ambulatory Visit | Attending: Hematology & Oncology | Admitting: Hematology & Oncology

## 2015-02-21 DIAGNOSIS — C189 Malignant neoplasm of colon, unspecified: Secondary | ICD-10-CM

## 2015-02-21 DIAGNOSIS — J479 Bronchiectasis, uncomplicated: Secondary | ICD-10-CM | POA: Diagnosis not present

## 2015-02-21 DIAGNOSIS — Z85038 Personal history of other malignant neoplasm of large intestine: Secondary | ICD-10-CM | POA: Diagnosis not present

## 2015-02-21 DIAGNOSIS — I700 Atherosclerosis of aorta: Secondary | ICD-10-CM | POA: Insufficient documentation

## 2015-02-21 LAB — POCT I-STAT CREATININE: Creatinine, Ser: 0.8 mg/dL (ref 0.44–1.00)

## 2015-02-21 MED ORDER — IOHEXOL 300 MG/ML  SOLN
100.0000 mL | Freq: Once | INTRAMUSCULAR | Status: AC | PRN
  Administered 2015-02-21: 100 mL via INTRAVENOUS

## 2015-02-21 MED ORDER — SODIUM CHLORIDE 0.9 % IJ SOLN
INTRAMUSCULAR | Status: DC
Start: 2015-02-21 — End: 2015-02-22
  Filled 2015-02-21: qty 30

## 2015-02-21 MED ORDER — SODIUM CHLORIDE 0.9 % IJ SOLN
INTRAMUSCULAR | Status: DC
Start: 2015-02-21 — End: 2015-02-22
  Filled 2015-02-21: qty 750

## 2015-02-25 ENCOUNTER — Encounter (HOSPITAL_COMMUNITY): Payer: Self-pay | Admitting: Lab

## 2015-02-25 NOTE — Progress Notes (Signed)
Referral sent to Dr Luan Pulling for abnormal ct.  Records faxed on 12/12.

## 2015-04-02 ENCOUNTER — Other Ambulatory Visit: Payer: Self-pay | Admitting: Pulmonary Disease

## 2015-04-02 NOTE — H&P (Signed)
NAMETECLA, MCCAGHREN               ACCOUNT NO.:  [id number]  MEDICAL RECORD NO.:  [id number]  LOCATION:  ENDO                          FACILITY:  [facility]  PHYSICIAN:  Jenalee Trevizo L. Luan Pulling, M.D.DATE OF BIRTH:  [birth year]  DATE OF ADMISSION:  03/19/2015 DATE OF DISCHARGE:  LH                             HISTORY & PHYSICAL   The patient is being scheduled for bronchoscopy.  REASON FOR BRONCHOSCOPY:  Abnormal chest CT.  HISTORY:  This is a 60 year old, who has a history of stage IIA moderately differentiated adenocarcinoma of the colon that extended into the pericolonic fatty tissue, muscularis propria, but did not have any evidence of metastatic disease.  She had clear resection margins, 15 lymph nodes were negative.  She had incidental appendectomy at the same time.  Her resection was done in 2012.  She has been followed with CEA levels and has had normal CEA levels with sudden elevation in the last several months.  She had imaging studies done in December 2016 that did not show any evidence of metastatic disease, but her chest CT showed "tree in bud" abnormality, mostly on the right lung.  There is some question as to whether this represents mycobacterium avium disease.  She has had cough and congestion.  She has a history of COPD.  She is still smoking about a package of cigarettes daily.  PAST MEDICAL HISTORY:  Positive for migraine headache, borderline diabetes, COPD, adenocarcinoma of the colon.  MEDICATIONS:  She is on no medications.  PAST SURGICAL HISTORY:  Surgically, she has had multiple colonoscopies. She had the colon cancer resection and skin cancers removed.  REVIEW OF SYSTEMS:  She does have some cough, some congestion, mild shortness of breath.  Otherwise negative.  No hemoptysis, no chest pain.  PHYSICAL EXAMINATION:  VITAL SIGNS:  Her vital signs are as recorded. GENERAL:  She is a thin female, in no acute distress. HEENT:  Her pupils are reactive.  Nose and throat  are clear.  Mucous membranes are moist. NECK:  Supple without masses.  Her chest is fairly clear with diminished breath sounds. HEART:  Regular without gallop. ABDOMEN:  Soft.  Surgical scar is well healed. EXTREMITIES:  No edema.  No clubbing no cyanosis and her central nervous system examination is grossly intact.  ASSESSMENT:  She has abnormal chest CT.  This could represent atypical infection such as mycobacterium avium intracellulare.  A plan is for bronchoscopy with bronchial washings for culture.  Even if her culture is positive it is not clear at this point that she needs to be treated.     Glennis Borger L. Luan Pulling, M.D.     ELH/MEDQ  D:  04/02/2015  T:  04/02/2015  Job:  [id number]  cc:   [place]

## 2015-04-03 ENCOUNTER — Encounter (HOSPITAL_COMMUNITY): Payer: Self-pay | Admitting: Anesthesiology

## 2015-04-03 ENCOUNTER — Ambulatory Visit (HOSPITAL_COMMUNITY)
Admission: RE | Admit: 2015-04-03 | Discharge: 2015-04-03 | Disposition: A | Discharge status: Home / Self Care | Payer: Medicaid Other | Source: Ambulatory Visit | Attending: Pulmonary Disease | Admitting: Pulmonary Disease

## 2015-04-03 ENCOUNTER — Encounter (HOSPITAL_COMMUNITY)
Admission: RE | Disposition: A | Discharge status: Home / Self Care | Payer: Self-pay | Source: Ambulatory Visit | Attending: Pulmonary Disease

## 2015-04-03 DIAGNOSIS — R918 Other nonspecific abnormal finding of lung field: Secondary | ICD-10-CM | POA: Insufficient documentation

## 2015-04-03 DIAGNOSIS — Z85038 Personal history of other malignant neoplasm of large intestine: Secondary | ICD-10-CM | POA: Insufficient documentation

## 2015-04-03 HISTORY — PX: FLEXIBLE BRONCHOSCOPY: SHX5094

## 2015-04-03 SURGERY — BRONCHOSCOPY, FLEXIBLE
Anesthesia: Moderate Sedation

## 2015-04-03 SURGERY — BRONCHOSCOPY, FLEXIBLE
Anesthesia: Moderate Sedation | Laterality: Bilateral

## 2015-04-03 MED ORDER — LIDOCAINE VISCOUS 2 % MT SOLN
OROMUCOSAL | Status: AC
  Filled 2015-04-03: qty 15

## 2015-04-03 MED ORDER — LIDOCAINE HCL (PF) 2 % IJ SOLN
INTRAMUSCULAR | Status: DC | PRN
  Administered 2015-04-03: 5 mg via INTRADERMAL

## 2015-04-03 MED ORDER — BUTAMBEN-TETRACAINE-BENZOCAINE 2-2-14 % EX AERO
INHALATION_SPRAY | CUTANEOUS | Status: DC | PRN
  Administered 2015-04-03: 1 via TOPICAL

## 2015-04-03 MED ORDER — LIDOCAINE HCL (PF) 2 % IJ SOLN
INTRAMUSCULAR | Status: AC
Start: 2015-04-03 — End: 2015-04-03
  Filled 2015-04-03: qty 10

## 2015-04-03 MED ORDER — LIDOCAINE HCL (PF) 2 % IJ SOLN
INTRAMUSCULAR | Status: AC
  Filled 2015-04-03: qty 10

## 2015-04-03 MED ORDER — ATROPINE SULFATE 1 MG/ML IJ SOLN
INTRAMUSCULAR | Status: AC
  Filled 2015-04-03: qty 1

## 2015-04-03 MED ORDER — LIDOCAINE HCL 2 % EX GEL
CUTANEOUS | Status: AC
  Filled 2015-04-03: qty 30

## 2015-04-03 MED ORDER — MIDAZOLAM HCL 5 MG/5ML IJ SOLN
INTRAMUSCULAR | Status: AC
  Filled 2015-04-03: qty 10

## 2015-04-03 MED ORDER — LIDOCAINE VISCOUS 2 % MT SOLN
OROMUCOSAL | Status: DC | PRN
  Administered 2015-04-03: 1 via OROMUCOSAL

## 2015-04-03 MED ORDER — LIDOCAINE HCL (PF) 2 % IJ SOLN
INTRAMUSCULAR | Status: AC
  Filled 2015-04-03: qty 2

## 2015-04-03 MED ORDER — MIDAZOLAM HCL 10 MG/2ML IJ SOLN
INTRAMUSCULAR | Status: DC | PRN
  Administered 2015-04-03: 2 mg via INTRAVENOUS

## 2015-04-03 SURGICAL SUPPLY — 16 items
BRUSH CYTOL CELLEBRITY 1.5X140 (MISCELLANEOUS) ×3 IMPLANT
CLOTH BEACON ORANGE TIMEOUT ST (SAFETY) ×3 IMPLANT
CONNECTOR 5 IN 1 STRAIGHT STRL (MISCELLANEOUS) ×3 IMPLANT
FORCEPS BIOP RJ4 1.8 (CUTTING FORCEPS) ×3 IMPLANT
GLOVE BIO SURGEON STRL SZ7.5 (GLOVE) ×3 IMPLANT
KIT CLEAN CATCH URINE (SET/KITS/TRAYS/PACK) IMPLANT
MARKER SKIN DUAL TIP RULER LAB (MISCELLANEOUS) ×3 IMPLANT
NS IRRIG 1000ML POUR BTL (IV SOLUTION) ×3 IMPLANT
SPONGE GAUZE 4X4 12PLY (GAUZE/BANDAGES/DRESSINGS) ×3 IMPLANT
SYR 20CC LL (SYRINGE) ×3 IMPLANT
SYR 30ML LL (SYRINGE) ×3 IMPLANT
SYR CONTROL 10ML LL (SYRINGE) ×3 IMPLANT
TRAP SPECIMEN CP (MISCELLANEOUS) ×3 IMPLANT
VALVE DISPOSABLE (MISCELLANEOUS) ×3 IMPLANT
WATER STERILE IRR 1000ML POUR (IV SOLUTION) ×3 IMPLANT
YANKAUER SUCT BULB TIP 10FT TU (MISCELLANEOUS) ×6 IMPLANT

## 2015-04-03 NOTE — Op Note (Signed)
Bronchoscopy Procedure Note April Wade [id number] 11-Jan-1956  Procedure: Bronchoscopy Indications: Obtain specimens for culture and/or other diagnostic studies and Abnormal chest CT status post colon cancer  Procedure Details Consent: Risks of procedure as well as the alternatives and risks of each were explained to the (patient/caregiver).  Consent for procedure obtained. Time Out: Verified patient identification, verified procedure, site/side was marked, verified correct patient position, special equipment/implants available, medications/allergies/relevent history reviewed, required imaging and test results available.  Performed  In preparation for procedure, bronchoscope lubricated and She was given Cetacaine spray and Xylocaine viscous solution both in her mouth and in her nose. Sedation: Benzodiazepines  Airway entered and the following bronchi were examined: RUL, RML, RLL, LUL and LLL.   Procedures performed: Brushings performed this is an error brushings were not performed but bronchial lavage was done right lower lobe and lesser to the left lower lobe Bronchoscope removed.    Evaluation Hemodynamic Status: BP stable throughout; O2 sats: stable throughout Patient's Current Condition: stable Specimens:  Sent for culture Complications: No apparent complications Patient did tolerate procedure well.   Mancel Lardizabal L 04/03/2015

## 2015-04-03 NOTE — Discharge Instructions (Signed)
°  Flexible Bronchoscopy, Care After Refer to this sheet in the next few weeks. These instructions provide you with information on caring for yourself after your procedure. Your health care provider may also give you more specific instructions. Your treatment has been planned according to current medical practices, but problems sometimes occur. Call your health care provider if you have any problems or questions after your procedure.  WHAT TO EXPECT AFTER THE PROCEDURE It is normal to have the following symptoms for 24-48 hours after the procedure:   Increased cough.  Low-grade fever.  Sore throat or hoarse voice.  Small streaks of blood in your thick spit (sputum) if tissue samples were taken (biopsy). HOME CARE INSTRUCTIONS   Do not eat or drink anything until after noon. Your nose and throat were numbed by medicine. If you try to eat or drink before the medicine wears off, food or drink could go into your lungs or you could burn yourself. After the numbness is gone and your cough and gag reflexes have returned, you may eat soft food and drink liquids slowly.   The day after the procedure, you can go back to your normal diet.   You may resume normal activities.   Keep all follow-up visits as directed by your health care provider. It is important to keep all your appointments, especially if tissue samples were taken for testing (biopsy). SEEK IMMEDIATE MEDICAL CARE IF:   You have increasing shortness of breath.   You become light-headed or faint.   You have chest pain.   You have any new concerning symptoms.  You cough up more than a small amount of blood.  The amount of blood you cough up increases. MAKE SURE YOU:  Understand these instructions.  Will watch your condition.  Will get help right away if you are not doing well or get worse.   This information is not intended to replace advice given to you by your health care provider. Make sure you discuss any questions  you have with your health care provider.   Document Released: 09/19/2004 Document Revised: 03/23/2014 Document Reviewed: 11/04/2012 Elsevier Interactive Patient Education 2016 [employer]  PATIENT INSTRUCTIONS POST-ANESTHESIA  IMMEDIATELY FOLLOWING SURGERY:  Do not drive or operate machinery for the first twenty four hours after surgery.  Do not make any important decisions for twenty four hours after surgery or while taking narcotic pain medications or sedatives.  If you develop intractable nausea and vomiting or a severe headache please notify your doctor immediately.  FOLLOW-UP:  Please make an appointment with your surgeon as instructed. You do not need to follow up with anesthesia unless specifically instructed to do so.  WOUND CARE INSTRUCTIONS (if applicable):  Keep a dry clean dressing on the anesthesia/puncture wound site if there is drainage.  Once the wound has quit draining you may leave it open to air.  Generally you should leave the bandage intact for twenty four hours unless there is drainage.  If the epidural site drains for more than 36-48 hours please call the anesthesia department.  QUESTIONS?:  Please feel free to call your physician or the hospital operator if you have any questions, and they will be happy to assist you.

## 2015-04-03 NOTE — H&P (Signed)
Patient seen and examined. No change since H&P .

## 2015-04-06 LAB — CULTURE, RESPIRATORY W GRAM STAIN

## 2015-04-06 LAB — CULTURE, RESPIRATORY

## 2015-04-09 ENCOUNTER — Encounter (HOSPITAL_COMMUNITY): Payer: Self-pay | Admitting: Pulmonary Disease

## 2015-04-26 LAB — FUNGUS CULTURE W SMEAR: FUNGAL SMEAR: NONE SEEN

## 2015-05-07 ENCOUNTER — Other Ambulatory Visit (HOSPITAL_COMMUNITY): Payer: Self-pay | Admitting: Family Medicine

## 2015-05-07 DIAGNOSIS — Z1231 Encounter for screening mammogram for malignant neoplasm of breast: Secondary | ICD-10-CM

## 2015-05-09 LAB — PNEUMOCYSTIS JIROVECI SMEAR BY DFA

## 2015-05-16 LAB — AFB CULTURE WITH SMEAR (NOT AT ARMC): Acid Fast Smear: NONE SEEN

## 2015-05-20 ENCOUNTER — Ambulatory Visit (HOSPITAL_COMMUNITY)
Admission: RE | Admit: 2015-05-20 | Discharge: 2015-05-20 | Disposition: A | Discharge status: Home / Self Care | Payer: Medicaid Other | Source: Ambulatory Visit | Attending: Family Medicine | Admitting: Family Medicine

## 2015-05-20 DIAGNOSIS — Z1231 Encounter for screening mammogram for malignant neoplasm of breast: Secondary | ICD-10-CM | POA: Diagnosis not present

## 2015-05-23 ENCOUNTER — Other Ambulatory Visit: Payer: Self-pay | Admitting: Otolaryngology

## 2015-05-23 ENCOUNTER — Encounter (HOSPITAL_COMMUNITY): Payer: Medicaid Other | Attending: Hematology & Oncology | Admitting: Hematology & Oncology

## 2015-05-23 ENCOUNTER — Ambulatory Visit (INDEPENDENT_AMBULATORY_CARE_PROVIDER_SITE_OTHER): Payer: Medicaid Other | Admitting: Otolaryngology

## 2015-05-23 ENCOUNTER — Encounter (HOSPITAL_COMMUNITY): Payer: Self-pay | Admitting: Hematology & Oncology

## 2015-05-23 ENCOUNTER — Encounter (HOSPITAL_COMMUNITY): Payer: Medicaid Other

## 2015-05-23 VITALS — BP 113/50 | HR 65 | Temp 98.0°F | Resp 18 | Wt 124.8 lb

## 2015-05-23 DIAGNOSIS — J449 Chronic obstructive pulmonary disease, unspecified: Secondary | ICD-10-CM | POA: Diagnosis not present

## 2015-05-23 DIAGNOSIS — R970 Elevated carcinoembryonic antigen [CEA]: Secondary | ICD-10-CM | POA: Insufficient documentation

## 2015-05-23 DIAGNOSIS — C189 Malignant neoplasm of colon, unspecified: Secondary | ICD-10-CM

## 2015-05-23 DIAGNOSIS — Z85828 Personal history of other malignant neoplasm of skin: Secondary | ICD-10-CM | POA: Insufficient documentation

## 2015-05-23 DIAGNOSIS — E876 Hypokalemia: Secondary | ICD-10-CM | POA: Diagnosis not present

## 2015-05-23 DIAGNOSIS — E611 Iron deficiency: Secondary | ICD-10-CM

## 2015-05-23 DIAGNOSIS — C44310 Basal cell carcinoma of skin of unspecified parts of face: Secondary | ICD-10-CM | POA: Diagnosis not present

## 2015-05-23 DIAGNOSIS — Z9889 Other specified postprocedural states: Secondary | ICD-10-CM | POA: Insufficient documentation

## 2015-05-23 DIAGNOSIS — D509 Iron deficiency anemia, unspecified: Secondary | ICD-10-CM | POA: Insufficient documentation

## 2015-05-23 DIAGNOSIS — L989 Disorder of the skin and subcutaneous tissue, unspecified: Secondary | ICD-10-CM

## 2015-05-23 LAB — COMPREHENSIVE METABOLIC PANEL
ALBUMIN: 3.8 g/dL (ref 3.5–5.0)
ALT: 17 U/L (ref 14–54)
AST: 20 U/L (ref 15–41)
Alkaline Phosphatase: 74 U/L (ref 38–126)
Anion gap: 6 (ref 5–15)
BILIRUBIN TOTAL: 0.5 mg/dL (ref 0.3–1.2)
BUN: 12 mg/dL (ref 6–20)
CHLORIDE: 104 mmol/L (ref 101–111)
CO2: 29 mmol/L (ref 22–32)
Calcium: 8.8 mg/dL — ABNORMAL LOW (ref 8.9–10.3)
Creatinine, Ser: 0.72 mg/dL (ref 0.44–1.00)
GFR calc Af Amer: >60 mL/min (ref >60)
GFR calc non Af Amer: >60 mL/min (ref >60)
GLUCOSE: 114 mg/dL — AB (ref 65–99)
POTASSIUM: 3.4 mmol/L — AB (ref 3.5–5.1)
Sodium: 139 mmol/L (ref 135–145)
Total Protein: 7 g/dL (ref 6.5–8.1)

## 2015-05-23 LAB — CBC WITH DIFFERENTIAL/PLATELET
BASOS ABS: 0 10*3/uL (ref 0.0–0.1)
BASOS PCT: 0 %
EOS PCT: 2 %
Eosinophils Absolute: 0.1 10*3/uL (ref 0.0–0.7)
HEMATOCRIT: 35.8 % — AB (ref 36.0–46.0)
Hemoglobin: 11.9 g/dL — ABNORMAL LOW (ref 12.0–15.0)
Lymphocytes Relative: 48 %
Lymphs Abs: 3.9 10*3/uL (ref 0.7–4.0)
MCH: 30.4 pg (ref 26.0–34.0)
MCHC: 33.2 g/dL (ref 30.0–36.0)
MCV: 91.6 fL (ref 78.0–100.0)
MONO ABS: 0.5 10*3/uL (ref 0.1–1.0)
MONOS PCT: 6 %
Neutro Abs: 3.6 10*3/uL (ref 1.7–7.7)
Neutrophils Relative %: 44 %
PLATELETS: 225 10*3/uL (ref 150–400)
RBC: 3.91 MIL/uL (ref 3.87–5.11)
RDW: 15 % (ref 11.5–15.5)
WBC: 8.1 10*3/uL (ref 4.0–10.5)

## 2015-05-23 MED ORDER — POTASSIUM CHLORIDE CRYS ER 20 MEQ PO TBCR: 20.0000 meq | EXTENDED_RELEASE_TABLET | Freq: Every day | ORAL | Status: DC

## 2015-05-23 NOTE — Progress Notes (Signed)
April Kilts, MD [address] [place] [id number]  Colonoscopy 08/24/2013 CT C/A/P 02/2014  CURRENT THERAPY: Surveillance per NCCN guidelines  INTERVAL HISTORY: April Wade 60 y.o. 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 Resection margins were clear and she also had associated appendectomy which was negative for pathology. She had resection on 11/21/2010. She has had normal CEA levels and had a sudden elevation in the last several months. She had imaging studies in June that showed no findings suggestive of metastatic disease. She had segmental narrowing in the mid sigmoid colon that was felt to be due to contraction. Her last colonoscopy was in June 2015. She had several polyps but no other suspicious findings.  April Wade was here alone today.  She said that she feels good but that her allergies have been messing up a little.  Her appetite is good. She states that her energy is okay. She said that her bowels are okay but they have been watery since her surgery. She does not use immodium regularly.  Her potassium levels were low today and during her last visit she was given potassium. She states that she has been eating bananas, vegetables and fruits. She has not been recently taking her potassium.  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 Romona Curls several years ago. She has noticed a "knot" under the incision site. She denies pain in this area but wonders if the cancer has recurrred.   She is still smoking but has been cutting back. She got a pack 3 days ago and she still has it. She recently went to see Dr. Georgiann Cocker, a pulmonologist.   She has no other complaints or concerns today.   Past Medical History  Diagnosis Date  .  Headache, migraine   . Borderline diabetic   . Skin cancer of face     colon dx 10/2010  . COPD (chronic obstructive pulmonary disease) ([place])   . Shortness of breath   . Asthma   . Adenocarcinoma ([place])     abdominal wall  . Arthritis   . Dental caries   . 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 Resection margins were clear and she also had associated appendectomy which was negative for pathology. She had resection on 11/21/2010.     has Adenocarcinoma of colon ([place]) on her problem list.     has No Known Allergies.  April Wade does not currently have medications on file.  Past Surgical History  Procedure Laterality Date  . Skin cancer excision      [place]  . Colonoscopy  10/29/2010    Procedure: COLONOSCOPY;  Surgeon: Rogene Houston, MD;  [facility];  Service: Endoscopy;  Laterality: N/A;  . Colostomy revision  11/21/2010    Procedure: COLON RESECTION RIGHT;  Surgeon: Scherry Ran;  Location: [facility];  Service: General;  Laterality: Right;  Right Colectomy, Frozen section.  Pathology notified 11/20/2010  . Mass excision  08/03/2011    Procedure: EXCISION MASS;  Surgeon: Scherry Ran, MD;  Location: [facility];  Service: General;  Laterality: N/A;  Abdominal Wall Mass with repair of abdominal wall defect with proceed mesh  . Abdominal wall defect repair  08/03/2011    Procedure: REPAIR ABDOMINAL WALL;  Surgeon: Scherry Ran, MD;  Location: [facility];  Service: General;  Laterality: N/A;  . Colonoscopy N/A 02/15/2013    Procedure: COLONOSCOPY;  Surgeon: Rogene Houston, MD;  [facility];  Service: Endoscopy;  Laterality: N/A;  1030-moved to 1120 Ann to notify pt  . Colon surgery    . Colonoscopy N/A 09/27/2013    Procedure: COLONOSCOPY;  Surgeon: Rogene Houston, MD;  [facility]  [facility];  Service: Endoscopy;  Laterality: N/A;  730 -- with fluoro  . Skin cancer excision  2015    skin cancer removed from nose  . Flexible bronchoscopy Bilateral 04/03/2015    Procedure: FLEXIBLE BRONCHOSCOPY WITH BX;  Surgeon: Sinda Du, MD;  Location: [facility];  Service: Cardiopulmonary;  Laterality: Bilateral;  Positive for diarrhea. Has had watery stool since her surgery.  Denies any headaches, dizziness, double vision, fevers, chills, night sweats, nausea, vomiting, constipation, chest pain, heart palpitations, shortness of breath, blood in stool, black tarry stool, urinary pain, urinary burning, urinary frequency, hematuria. 14 point review of systems was performed and is negative except as detailed under history of present illness and above   PHYSICAL EXAMINATION  ECOG PERFORMANCE STATUS: 1 - Symptomatic but completely ambulatory  Filed Vitals:   05/23/15 1313  BP: 113/50  Pulse: 65  Temp: 98 F (36.7 C)  Resp: 18    GENERAL:alert, no distress, well nourished, well developed, comfortable, cooperative and smiling SKIN: skin color, texture, turgor are normal, no rashes. Round palpable subcutaneous 1cm lesion, left cheek just lateral to the nose HEAD: Normocephalic EYES: normal, PERRLA, EOMI, Conjunctiva are pink and non-injected EARS: External ears normal OROPHARYNX:mucous membranes are moist  NECK: supple, no adenopathy, thyroid normal size, non-tender, without nodularity, no stridor, non-tender, trachea midline. Small palpable mobile LN left posterior neck, not pathologic LYMPH:  no palpable lymphadenopathy, no hepatosplenomegaly BREAST:not examined LUNGS: clear to auscultation and percussion HEART: regular rate & rhythm, no murmurs, no gallops, S1 normal and S2 normal ABDOMEN:abdomen soft, non-tender, normal bowel sounds, no masses or organomegaly, and no hepatosplenomegaly BACK: Back symmetric, no curvature., No CVA tenderness EXTREMITIES:less then 2  second capillary refill, no joint deformities, effusion, or inflammation, no edema, no skin discoloration  NEURO: alert & oriented x 3 with fluent speech, no focal motor/sensory deficits, gait normal   LABORATORY DATA: I have reviewed the data as listed.   Results for April, Wade (MRN [id number]) as of 05/23/2015 17:01  Ref. Range 05/23/2015 11:58  Sodium Latest Ref Range: 135-145 mmol/L 139  Potassium Latest Ref Range: 3.5-5.1 mmol/L 3.4 (L)  Chloride Latest Ref Range: 101-111 mmol/L 104  CO2 Latest Ref Range: 22-32 mmol/L 29  BUN Latest Ref Range: 6-20 mg/dL 12  Creatinine Latest Ref Range: 0.44-1.00 mg/dL 0.72  Calcium Latest Ref Range: 8.9-10.3 mg/dL 8.8 (L)  EGFR (Non-African Amer.) Latest Ref Range: >60 mL/min >60  EGFR (African American) Latest Ref Range: >60 mL/min >60  Glucose Latest Ref Range: 65-99 mg/dL 114 (H)  Anion gap Latest Ref Range: 5-15  6  Alkaline Phosphatase Latest Ref Range: 38-126 U/L 74  Albumin Latest Ref Range: 3.5-5.0 g/dL 3.8  AST Latest Ref Range: 15-41 U/L 20  ALT Latest Ref Range: 14-54 U/L 17  Total Protein Latest Ref Range: 6.5-8.1 g/dL 7.0  Total Bilirubin Latest Ref Range: 0.3-1.2  mg/dL 0.5  WBC Latest Ref Range: 4.0-10.5 K/uL 8.1  RBC Latest Ref Range: 3.87-5.11 MIL/uL 3.91  Hemoglobin Latest Ref Range: 12.0-15.0 g/dL 11.9 (L)  HCT Latest Ref Range: 36.0-46.0 % 35.8 (L)  MCV Latest Ref Range: 78.0-100.0 fL 91.6  MCH Latest Ref Range: 26.0-34.0 pg 30.4  MCHC Latest Ref Range: 30.0-36.0 g/dL 33.2  RDW Latest Ref Range: 11.5-15.5 % 15.0  Platelets Latest Ref Range: 150-400 K/uL 225  Neutrophils Latest Units: % 44  Lymphocytes Latest Units: % 48  Monocytes Relative Latest Units: % 6  Eosinophil Latest Units: % 2  Basophil Latest Units: % 0  NEUT# Latest Ref Range: 1.7-7.7 K/uL 3.6  Lymphocyte # Latest Ref Range: 0.7-4.0 K/uL 3.9  Monocyte # Latest Ref Range: 0.1-1.0 K/uL 0.5  Eosinophils Absolute Latest Ref Range: 0.0-0.7 K/uL 0.1    Basophils Absolute Latest Ref Range: 0.0-0.1 K/uL 0.0   Results for April, Wade (MRN [id number]) as of 06/07/2015 16:27  Ref. Range 08/21/2014 10:35 08/30/2014 11:15 09/20/2014 09:43 11/23/2014 12:29 05/23/2015 11:58  CEA Latest Ref Range: 0.0-4.7 ng/mL 6.1 (H) 6.7 (H) 7.5 (H) 8.2 (H) 6.0 (H)    RADIOGRAPHIC STUDIES:   ASSESSMENT:  1. 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 Resection margins were clear and she also had associated appendectomy which was negative for pathology. She had resection on 11/21/2010.  2. Abdominal wall mass, s/p excision by Dr. Romona Curls on 08/03/2011, negative for malignancy.  3. Tobacco Abuse 4. Hypokalemia 5. Iron deficiency 6. Chronically elevated CEA since 08/15/8364 , uncertain significance 7. Subcutaneous facial lesion   THERAPY PLAN:   She has been a chronic smoker but is had normal CEA levels. It is somewhat concerning that her CEA has risen and I have discussed this with her. I am uncertain of the significance. She had CT imaging in December, I have arranged for repeat imaging in June. I scheduled CT scans for her chest, abdomen and pelvis on 08/23/15.  We have discussed in the past referral back for another C-scope.   We discussed smoking cessation. I offered her multiple options to discontinue including classes, medications. She currently feels it is too difficult to stop. We discussed the benefits of smoking cessation.   We will continue to follow NCCN guidelines for surveillance: NCCN guidelines for Stage II/III Colon Cancer are as follows:  1. H+P every 3-6 months for 2 years and then every 6 months for a total of 5 years.  2. CEA every 3-6 months for 2 years and then every 6 months for a total of 5 years (completing in fall 2017)  3. Colonoscopy at 1 year and then as indicated (except if no preoperative colonoscopy due  to obstructing lesion, colonoscopy in 3-6 months)   A. If advanced adenoma, then repeat in 1 year   B. If no advanced adenoma, repeat in 3 years, then every 5 years.   4. CT CAP annually for up to 5 years for patients at high risk for recurrence   I am referring her to Dr. Benjamine Mola for the subcutaneous area under her eye, it is certainly concerning for recurrence of her basal cell.   She is going to try Imodium for her watery stool.  She will return in 2 weeks for repeat potassium level.  We will see her back after her scans in June.   All questions were answered. The patient  knows to call the clinic with any problems, questions or concerns. We can certainly see the patient much sooner if necessary.  This document serves as a record of services personally performed by Ancil Linsey, MD. It was created on her behalf by Kandace Blitz, a trained medical scribe. The creation of this record is based on the scribe's personal observations and the provider's statements to them. This document has been checked and approved by the attending provider.   I have reviewed the above documentation for accuracy and completeness and I agree with the above. This note was electronically signed.  Molli Hazard, MD

## 2015-05-23 NOTE — Patient Instructions (Addendum)
[place] at [facility] Discharge Instructions  RECOMMENDATIONS MADE BY THE CONSULTANT AND ANY TEST RESULTS WILL BE SENT TO YOUR REFERRING PHYSICIAN.   Exam and discussion by Dr Whitney Muse today Potassium sent to your pharmacy, your potassium was just a little low CEA still pending, we will call you with these results We are going to check your potassium level in 2 weeks Dr Whitney Muse is going to make a phone call to Dr Benjamine Mola Imodium (you can get this OTC) you can take 1 if this does not help you can take 2 and you can take it in the morning when you get up to see if it will help with our loose stools CT scans in June  Return to see the doctor in 6 months Please call the clinic if you have any questions or concerns    Thank you for choosing [place] at [facility] to provide your oncology and hematology care.  To afford each patient quality time with our provider, please arrive at least 15 minutes before your scheduled appointment time.   Beginning January 23rd 2017 lab work for the [facility] will be done in the  Main lab at [facility] on 1st floor. If you have a lab appointment with the [place] please come in thru the  Main Entrance and check in at the main information desk  You need to re-schedule your appointment should you arrive 10 or more minutes late.  We strive to give you quality time with our providers, and arriving late affects you and other patients whose appointments are after yours.  Also, if you no show three or more times for appointments you may be dismissed from the clinic at the providers discretion.     Again, thank you for choosing [facility].  Our hope is that these requests will decrease the amount of time that you wait before being seen by our physicians.       _____________________________________________________________  Should you have questions after your visit to [facility],  please contact our office at [phone] between the hours of 8:30 a.m. and 4:30 p.m.  Voicemails left after 4:30 p.m. will not be returned until the following business day.  For prescription refill requests, have your pharmacy contact our office.         Resources For Cancer Patients and their Caregivers ? American Cancer Society: Can assist with transportation, wigs, general needs, runs Look Good Feel Better.        (320)473-1524 ? Cancer Care: Provides financial assistance, online support groups, medication/co-pay assistance.  1-800-813-HOPE 650 200 2848) ? [facility] Assists [place] cancer patients and their families through emotional , educational and financial support.  931-156-7921 ? [place] DSS Where to apply for food stamps, Medicaid and utility assistance. 435-055-5092 ? [facility]: Transportation to medical appointments. 310-337-7674 ? Social Security Administration: May apply for disability if have a Stage IV cancer. (913)652-3175 5754501571 ? [employer], Disability and Transit Services: Assists with nutrition, care and transit needs. [phone]

## 2015-05-24 ENCOUNTER — Encounter (HOSPITAL_BASED_OUTPATIENT_CLINIC_OR_DEPARTMENT_OTHER): Payer: Self-pay | Admitting: *Deleted

## 2015-05-24 LAB — CEA: CEA: 6 ng/mL — AB (ref 0.0–4.7)

## 2015-05-27 ENCOUNTER — Encounter (HOSPITAL_BASED_OUTPATIENT_CLINIC_OR_DEPARTMENT_OTHER): Payer: Self-pay | Admitting: *Deleted

## 2015-05-27 ENCOUNTER — Encounter (HOSPITAL_BASED_OUTPATIENT_CLINIC_OR_DEPARTMENT_OTHER)
Admission: RE | Disposition: A | Discharge status: Home / Self Care | Payer: Self-pay | Source: Ambulatory Visit | Attending: Otolaryngology

## 2015-05-27 ENCOUNTER — Ambulatory Visit (HOSPITAL_BASED_OUTPATIENT_CLINIC_OR_DEPARTMENT_OTHER)
Admission: RE | Admit: 2015-05-27 | Discharge: 2015-05-27 | Disposition: A | Discharge status: Home / Self Care | Payer: Medicaid Other | Source: Ambulatory Visit | Attending: Otolaryngology | Admitting: Otolaryngology

## 2015-05-27 DIAGNOSIS — C44319 Basal cell carcinoma of skin of other parts of face: Secondary | ICD-10-CM | POA: Insufficient documentation

## 2015-05-27 DIAGNOSIS — L720 Epidermal cyst: Secondary | ICD-10-CM | POA: Insufficient documentation

## 2015-05-27 DIAGNOSIS — R220 Localized swelling, mass and lump, head: Secondary | ICD-10-CM | POA: Diagnosis present

## 2015-05-27 DIAGNOSIS — D2339 Other benign neoplasm of skin of other parts of face: Secondary | ICD-10-CM | POA: Diagnosis not present

## 2015-05-27 HISTORY — PX: MASS EXCISION: SHX2000

## 2015-05-27 SURGERY — MINOR EXCISION OF MASS
Anesthesia: LOCAL | Site: Face | Laterality: Left

## 2015-05-27 MED ORDER — LIDOCAINE-EPINEPHRINE 1 %-1:100000 IJ SOLN
INTRAMUSCULAR | Status: DC | PRN
  Administered 2015-05-27: 2.5 mL

## 2015-05-27 MED ORDER — LIDOCAINE-EPINEPHRINE 1 %-1:100000 IJ SOLN
INTRAMUSCULAR | Status: AC
  Filled 2015-05-27: qty 1

## 2015-05-27 MED ORDER — GLYCOPYRROLATE 0.2 MG/ML IJ SOLN: 0.2000 mg | Freq: Once | INTRAMUSCULAR | Status: DC | PRN

## 2015-05-27 MED ORDER — LACTATED RINGERS IV SOLN: INTRAVENOUS | Status: DC

## 2015-05-27 MED ORDER — OXYCODONE-ACETAMINOPHEN 5-325 MG PO TABS: 1.0000 | ORAL_TABLET | ORAL | Status: DC | PRN

## 2015-05-27 MED ORDER — MIDAZOLAM HCL 2 MG/2ML IJ SOLN: 1.0000 mg | INTRAMUSCULAR | Status: DC | PRN

## 2015-05-27 MED ORDER — FENTANYL CITRATE (PF) 100 MCG/2ML IJ SOLN: 50.0000 ug | INTRAMUSCULAR | Status: DC | PRN

## 2015-05-27 MED ORDER — AMOXICILLIN 875 MG PO TABS
875.0000 mg | ORAL_TABLET | Freq: Two times a day (BID) | ORAL | Status: DC
Start: 2015-05-27 — End: 2015-11-26

## 2015-05-27 MED ORDER — SCOPOLAMINE 1 MG/3DAYS TD PT72: 1.0000 | MEDICATED_PATCH | Freq: Once | TRANSDERMAL | Status: DC | PRN

## 2015-05-27 SURGICAL SUPPLY — 27 items
BLADE SURG 15 STRL LF DISP TIS (BLADE) ×1 IMPLANT
BLADE SURG 15 STRL SS (BLADE) ×1
ELECT COATED BLADE 2.86 ST (ELECTRODE) IMPLANT
ELECT NEEDLE BLADE 2-5/6 (NEEDLE) ×2 IMPLANT
ELECT REM PT RETURN 9FT ADLT (ELECTROSURGICAL) ×2
ELECTRODE REM PT RTRN 9FT ADLT (ELECTROSURGICAL) ×1 IMPLANT
GAUZE SPONGE 4X4 16PLY XRAY LF (GAUZE/BANDAGES/DRESSINGS) ×2 IMPLANT
GLOVE BIO SURGEON STRL SZ7.5 (GLOVE) ×2 IMPLANT
GLOVE EXAM NITRILE EXT CUFF MD (GLOVE) ×2 IMPLANT
GLOVE SURG SS PI 7.0 STRL IVOR (GLOVE) ×2 IMPLANT
LIQUID BAND (GAUZE/BANDAGES/DRESSINGS) ×2 IMPLANT
NEEDLE HYPO 25X1 1.5 SAFETY (NEEDLE) IMPLANT
NEEDLE PRECISIONGLIDE 27X1.5 (NEEDLE) ×4 IMPLANT
NS IRRIG 1000ML POUR BTL (IV SOLUTION) IMPLANT
PACK BASIN DAY SURGERY FS (CUSTOM PROCEDURE TRAY) ×2 IMPLANT
PAD ALCOHOL SWAB (MISCELLANEOUS) ×2 IMPLANT
PENCIL BUTTON HOLSTER BLD 10FT (ELECTRODE) ×2 IMPLANT
SHEET MEDIUM DRAPE 40X70 STRL (DRAPES) ×2 IMPLANT
SUCTION FRAZIER HANDLE 10FR (MISCELLANEOUS) ×1
SUCTION TUBE FRAZIER 10FR DISP (MISCELLANEOUS) ×1 IMPLANT
SUT PROLENE 5 0 PS 2 (SUTURE) IMPLANT
SUT VIC AB 3-0 FS2 27 (SUTURE) IMPLANT
SUT VICRYL 4-0 PS2 18IN ABS (SUTURE) ×2 IMPLANT
SWABSTICK POVIDONE IODINE SNGL (MISCELLANEOUS) ×4 IMPLANT
SYR CONTROL 10ML LL (SYRINGE) ×4 IMPLANT
TOWEL OR 17X24 6PK STRL BLUE (TOWEL DISPOSABLE) ×2 IMPLANT
TUBE CONNECTING 20X1/4 (TUBING) ×2 IMPLANT

## 2015-05-27 NOTE — H&P (Signed)
Cc: Left facial mass  HPI: The patient is a 60 year old female who presents today for evaluation of her left facial mass.  The patient is seen in consultation requested by Dr. Ancil Linsey.  The patient has a history of skin cancer on her left midface and her nose.  She underwent surgical removal of her skin cancer approximately 6 years ago.  Over the past few months, she has noted a 1 cm left midface mass.  The mass is nontender to touch.  It is directly beneath her previous incision site.  The patient denies any facial weakness or numbness.  She also denies any visual difficulty.   The patient's review of systems (constitutional, eyes, ENT, cardiovascular, respiratory, GI, musculoskeletal, skin, neurologic, psychiatric, endocrine, hematologic, allergic) is noted in the ROS questionnaire.  It is reviewed with the patient.  Family health history: None.   Major events: Excision of mass in colon and abdomin.   Ongoing medical problems: Migraines, skin cancer, COPD, Asthma, Adenocarcinoma of the abdomin and colon.  Social history: The pateint is divorced. She smokes one half pack of cigarettes daily. She denies the use of alcohol or illegal drugs.   Exam General: Communicates without difficulty, well nourished, no acute distress. Head: Normocephalic, no evidence injury, no tenderness, facial buttresses intact without stepoff. Eyes: PERRL, EOMI. No scleral icterus, conjunctivae clear. Neuro: CN II exam reveals vision grossly intact.  No nystagmus at any point of gaze. Ears: Auricles well formed without lesions.  Ear canals are intact without mass or lesion.  No erythema or edema is appreciated.  The TMs are intact without fluid. Nose: External evaluation reveals normal support and skin without lesions.  Dorsum is intact.  Anterior rhinoscopy reveals healthy pink mucosa over anterior aspect of inferior turbinates and intact septum.  No purulence noted. A 1 cm mobile mass is noted within the left  midface.  The mass appears to be subcutaneous. Oral:  Oral cavity and oropharynx are intact, symmetric, without erythema or edema.  Mucosa is moist without lesions. Neck: Full range of motion without pain.  There is no significant lymphadenopathy.  No masses palpable.  Thyroid bed within normal limits to palpation.  Parotid glands and submandibular glands equal bilaterally without mass.  Trachea is midline. Neuro:  CN 2-12 grossly intact. Gait normal.   Assessment 1.  A 1 cm mobile mass is noted within the left midface.  The mass appears to be subcutaneous.  2.  The patient has a history of skin cancer over the same site approximately 6 years ago.   Plan  1.  The physical exam findings are reviewed with the patient.  2.  The treatment options of conservative observation versus surgical excision are discussed.  The risks, benefits, alternatives and details of the procedure are reviewed. 3.  The patient would like to proceed with the procedure.  We will schedule the procedure under local anesthesia in the operating room.

## 2015-05-27 NOTE — Op Note (Signed)
DATE OF PROCEDURE: 05/27/2015  OPERATIVE REPORT   SURGEON: Leta Baptist, MD  PREOPERATIVE DIAGNOSIS: Left facial mass  POSTOPERATIVE DIAGNOSIS: Left facial mass  PROCEDURES PERFORMED:  Excision of left facial mass (3cm incision) (CPT 11443)  ANESTHESIA: Local anesthesia with 1% lidocaine with 1-100,000 epinephrine  COMPLICATIONS: None.  ESTIMATED BLOOD LOSS: Minimal.  INDICATION FOR PROCEDURE:  April Wade is a 60 y.o. female with an enlarging left facial mass. The patient has a history of left facial basal cell carcinoma. She underwent excision of the skin cancer 6 years ago. The patient noted a mass over the same area last month. The size of the lesion has increased. The lesion appeared to be subcutaneous. The patient would like to have the lesion removed.  The risks, benefits, alternatives, and details of the procedures were discussed with the mother. Questions were invited and answered. Informed consent was obtained.  DESCRIPTION OF PROCEDURE: The patient was taken to the operating room and placed supine on the operating table.1% lidocaine with 1-100,000 epinephrine will was infiltrated at the planned site of incision. The incision was made over the previous incision scar. A small cutaneous nodule was noted adjacent to the incision. The nodule was removed. A 3 cm incision was made. Careful dissection was then carried out to free the subcutaneous mass from the surrounding soft tissue. The entire 1.5 cm lesion was removed and sent to the pathology department for permanent histologic identification. The surgical site was copiously irrigated. The incision was closed in layers with 4-0 Vicryl and Dermabond. The patient tolerated the procedure well. She was transferred to the recovery room in good condition.  OPERATIVE FINDINGS: A 1.5cm subcutaneous mass. A small skin nodule was also removed.  SPECIMEN:Facial mass  FOLLOWUP CARE: The patient will be discharged home  once she is awake and alert. She will follow up in my office in 1 week.

## 2015-05-27 NOTE — Discharge Instructions (Signed)
The patient may resume all her previous activities and diet. She will follow-up in my [place] office in one week. °

## 2015-05-28 ENCOUNTER — Encounter (HOSPITAL_BASED_OUTPATIENT_CLINIC_OR_DEPARTMENT_OTHER): Payer: Self-pay | Admitting: Otolaryngology

## 2015-06-06 ENCOUNTER — Ambulatory Visit (INDEPENDENT_AMBULATORY_CARE_PROVIDER_SITE_OTHER): Payer: Medicaid Other | Admitting: Otolaryngology

## 2015-06-06 ENCOUNTER — Encounter (HOSPITAL_COMMUNITY): Payer: Medicaid Other

## 2015-06-06 DIAGNOSIS — C189 Malignant neoplasm of colon, unspecified: Secondary | ICD-10-CM

## 2015-06-06 LAB — POTASSIUM: POTASSIUM: 3.8 mmol/L (ref 3.5–5.1)

## 2015-07-04 ENCOUNTER — Ambulatory Visit (HOSPITAL_COMMUNITY): Payer: Medicaid Other | Admitting: Hematology & Oncology

## 2015-07-04 ENCOUNTER — Other Ambulatory Visit (HOSPITAL_COMMUNITY): Payer: Medicaid Other

## 2015-08-22 ENCOUNTER — Telehealth (HOSPITAL_COMMUNITY): Payer: Self-pay | Admitting: Hematology & Oncology

## 2015-08-23 ENCOUNTER — Ambulatory Visit (HOSPITAL_COMMUNITY): Payer: Medicaid Other

## 2015-11-26 ENCOUNTER — Encounter (HOSPITAL_COMMUNITY): Payer: Self-pay | Admitting: Hematology & Oncology

## 2015-11-26 ENCOUNTER — Encounter (HOSPITAL_COMMUNITY): Payer: Medicaid Other

## 2015-11-26 ENCOUNTER — Encounter (HOSPITAL_COMMUNITY): Payer: Medicaid Other | Attending: Hematology & Oncology | Admitting: Hematology & Oncology

## 2015-11-26 VITALS — BP 123/58 | HR 69 | Temp 98.5°F | Resp 16 | Wt 121.0 lb

## 2015-11-26 DIAGNOSIS — R970 Elevated carcinoembryonic antigen [CEA]: Secondary | ICD-10-CM | POA: Insufficient documentation

## 2015-11-26 DIAGNOSIS — C189 Malignant neoplasm of colon, unspecified: Secondary | ICD-10-CM | POA: Diagnosis present

## 2015-11-26 DIAGNOSIS — E876 Hypokalemia: Secondary | ICD-10-CM | POA: Insufficient documentation

## 2015-11-26 DIAGNOSIS — J449 Chronic obstructive pulmonary disease, unspecified: Secondary | ICD-10-CM | POA: Diagnosis not present

## 2015-11-26 LAB — CBC WITH DIFFERENTIAL/PLATELET
Basophils Absolute: 0 10*3/uL (ref 0.0–0.1)
Basophils Relative: 0 %
EOS PCT: 3 %
Eosinophils Absolute: 0.2 10*3/uL (ref 0.0–0.7)
HCT: 35.8 % — ABNORMAL LOW (ref 36.0–46.0)
Hemoglobin: 11.9 g/dL — ABNORMAL LOW (ref 12.0–15.0)
LYMPHS ABS: 3.5 10*3/uL (ref 0.7–4.0)
LYMPHS PCT: 47 %
MCH: 31 pg (ref 26.0–34.0)
MCHC: 33.2 g/dL (ref 30.0–36.0)
MCV: 93.2 fL (ref 78.0–100.0)
MONO ABS: 0.5 10*3/uL (ref 0.1–1.0)
MONOS PCT: 6 %
Neutro Abs: 3.4 10*3/uL (ref 1.7–7.7)
Neutrophils Relative %: 44 %
PLATELETS: 209 10*3/uL (ref 150–400)
RBC: 3.84 MIL/uL — ABNORMAL LOW (ref 3.87–5.11)
RDW: 14.5 % (ref 11.5–15.5)
WBC: 7.6 10*3/uL (ref 4.0–10.5)

## 2015-11-26 LAB — COMPREHENSIVE METABOLIC PANEL
ALT: 12 U/L — ABNORMAL LOW (ref 14–54)
AST: 17 U/L (ref 15–41)
Albumin: 4 g/dL (ref 3.5–5.0)
Alkaline Phosphatase: 67 U/L (ref 38–126)
Anion gap: 10 (ref 5–15)
BUN: 12 mg/dL (ref 6–20)
CHLORIDE: 102 mmol/L (ref 101–111)
CO2: 30 mmol/L (ref 22–32)
Calcium: 9 mg/dL (ref 8.9–10.3)
Creatinine, Ser: 0.85 mg/dL (ref 0.44–1.00)
Glucose, Bld: 115 mg/dL — ABNORMAL HIGH (ref 65–99)
POTASSIUM: 2.9 mmol/L — AB (ref 3.5–5.1)
Sodium: 142 mmol/L (ref 135–145)
TOTAL PROTEIN: 6.8 g/dL (ref 6.5–8.1)
Total Bilirubin: 0.3 mg/dL (ref 0.3–1.2)

## 2015-11-26 NOTE — Progress Notes (Signed)
April Kilts, MD [place] [address]  Colonoscopy 08/24/2013 CT C/A/P 02/2014  CURRENT THERAPY: Surveillance per NCCN guidelines  INTERVAL HISTORY: April Wade 60 y.o. 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 CEA has been elevated since June of last year of uncertain etiology.   Patient continues to smoke. She states her appetite is normal and she feels great. However, she is still experiencing diarrhea, this is chronic. She says that she is taking imodium and that alleviates her symptoms but it does not stop them.   She denies any abdominal pain or shortness of breath. She states that she sometimes experiences a cough when it is cold outside but it is not new or unusual.  Patient has a colonoscopy coming up. Dr. Laural Wade does her colonoscopy. PCP is Dr. Hilma Wade. Mammogram was in March.  She has just had a new grandbaby and is very excited about this.  Past Medical History:  Diagnosis Date  . Adenocarcinoma ([place])    abdominal wall  . 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 Resection margins were clear and she also had associated appendectomy which was negative for pathology. She had resection on 11/21/2010.   . Arthritis   . Asthma   . Borderline diabetic   . COPD (chronic obstructive pulmonary disease) ([place])   . Dental caries   . Headache, migraine   . Shortness of breath   . Skin cancer of face    colon dx 10/2010   has Adenocarcinoma of colon ([place]) on her problem list.     has No Known Allergies.  April Wade does not currently have medications on file.  Past Surgical  History:  Procedure Laterality Date  . ABDOMINAL WALL DEFECT REPAIR  08/03/2011   Procedure: REPAIR ABDOMINAL WALL;  Surgeon: Scherry Ran, MD;  Location: [facility];  Service: General;  Laterality: N/A;  . COLON SURGERY    . COLONOSCOPY  10/29/2010   Procedure: COLONOSCOPY;  Surgeon: Rogene Houston, MD;  Location: [facility];  Service: Endoscopy;  Laterality: N/A;  . COLONOSCOPY N/A 02/15/2013   Procedure: COLONOSCOPY;  Surgeon: Rogene Houston, MD;  [facility];  Service: Endoscopy;  Laterality: N/A;  1030-moved to 1120 Ann to notify pt  . COLONOSCOPY N/A 09/27/2013   Procedure: COLONOSCOPY;  Surgeon: Rogene Houston, MD;  [facility];  Service: Endoscopy;  Laterality: N/A;  730 -- with fluoro  . COLOSTOMY REVISION  11/21/2010   Procedure: COLON RESECTION RIGHT;  Surgeon: Scherry Ran;  Location: [facility];  Service: General;  Laterality: Right;  Right Colectomy, Frozen section.  Pathology notified 11/20/2010  . FLEXIBLE BRONCHOSCOPY Bilateral 04/03/2015   Procedure: FLEXIBLE BRONCHOSCOPY WITH BX;  Surgeon: Sinda Du, MD;  [facility]: [facility];  Service: Cardiopulmonary;  Laterality: Bilateral;  . MASS EXCISION  08/03/2011   Procedure: EXCISION MASS;  Surgeon: Scherry Ran, MD;  Location: [facility];  Service: General;  Laterality: N/A;  Abdominal Wall Mass with repair of abdominal wall defect with proceed mesh  . MASS EXCISION Left 05/27/2015   Procedure: EXCISION OF LEFT FACIAL  MASS;  Surgeon: Kasandra Knudsen  Benjamine Mola, MD;  Location: [facility];  Service: ENT;  Laterality: Left;  . SKIN CANCER EXCISION     [place]  . SKIN CANCER EXCISION  2015   skin cancer removed from nose    Positive for diarrhea. Has had watery stool since her surgery. Denies any headaches, dizziness, double vision, fevers, chills, night sweats, nausea, vomiting, constipation, chest pain, heart palpitations, shortness of breath, blood in stool, black tarry stool, urinary pain, urinary  burning, urinary frequency, hematuria. 14 point review of systems was performed and is negative except as detailed under history of present illness and above  PHYSICAL EXAMINATION  ECOG PERFORMANCE STATUS: 1 - Symptomatic but completely ambulatory  Vitals:   11/26/15 1125  BP: (!) 123/58  Pulse: 69  Resp: 16  Temp: 98.5 F (36.9 C)    GENERAL:alert, no distress, well nourished, well developed, comfortable, cooperative and smiling SKIN: skin color, texture, turgor are normal, no rashes.  HEAD: Normocephalic EYES: normal, PERRLA, EOMI, Conjunctiva are pink and non-injected EARS: External ears normal OROPHARYNX:mucous membranes are moist  NECK: supple, no adenopathy, thyroid normal size, non-tender, without nodularity, no stridor, non-tender, trachea midline.  LYMPH:  no palpable lymphadenopathy, no hepatosplenomegaly BREAST:not examined LUNGS: clear to auscultation and percussion HEART: regular rate & rhythm, no murmurs, no gallops, S1 normal and S2 normal ABDOMEN: Abdominal mesh, non-tender, normal bowel sounds, no masses or organomegaly, and no hepatosplenomegaly BACK: Back symmetric, no curvature., No CVA tenderness EXTREMITIES:less then 2 second capillary refill, no joint deformities, effusion, or inflammation, no edema, no skin discoloration  NEURO: alert & oriented x 3 with fluent speech, no focal motor/sensory deficits, gait normal   LABORATORY DATA: I have reviewed the data as listed.  Results for April Wade, April Wade (MRN [id number])   Ref. Range 11/26/2015 09:58  Sodium Latest Ref Range: 135 - 145 mmol/L 142  Potassium Latest Ref Range: 3.5 - 5.1 mmol/L 2.9 (L)  Chloride Latest Ref Range: 101 - 111 mmol/L 102  CO2 Latest Ref Range: 22 - 32 mmol/L 30  BUN Latest Ref Range: 6 - 20 mg/dL 12  Creatinine Latest Ref Range: 0.44 - 1.00 mg/dL 0.85  Calcium Latest Ref Range: 8.9 - 10.3 mg/dL 9.0  EGFR (Non-African Amer.) Latest Ref Range: >60 mL/min >60  EGFR (African American)  Latest Ref Range: >60 mL/min >60  Glucose Latest Ref Range: 65 - 99 mg/dL 115 (H)  Anion gap Latest Ref Range: 5 - 15  10  Alkaline Phosphatase Latest Ref Range: 38 - 126 U/L 67  Albumin Latest Ref Range: 3.5 - 5.0 g/dL 4.0  AST Latest Ref Range: 15 - 41 U/L 17  ALT Latest Ref Range: 14 - 54 U/L 12 (L)  Total Protein Latest Ref Range: 6.5 - 8.1 g/dL 6.8  Total Bilirubin Latest Ref Range: 0.3 - 1.2 mg/dL 0.3  WBC Latest Ref Range: 4.0 - 10.5 K/uL 7.6  RBC Latest Ref Range: 3.87 - 5.11 MIL/uL 3.84 (L)  Hemoglobin Latest Ref Range: 12.0 - 15.0 g/dL 11.9 (L)  HCT Latest Ref Range: 36.0 - 46.0 % 35.8 (L)  MCV Latest Ref Range: 78.0 - 100.0 fL 93.2  MCH Latest Ref Range: 26.0 - 34.0 pg 31.0  MCHC Latest Ref Range: 30.0 - 36.0 g/dL 33.2  RDW Latest Ref Range: 11.5 - 15.5 % 14.5  Platelets Latest Ref Range: 150 - 400 K/uL 209  Neutrophils Latest Units: % 44  Lymphocytes Latest Units: % 47  Monocytes Relative Latest Units: % 6  Eosinophil Latest Units: % 3  Basophil Latest Units: % 0  NEUT# Latest Ref Range: 1.7 - 7.7 K/uL 3.4  Lymphocyte # Latest Ref Range: 0.7 - 4.0 K/uL 3.5  Monocyte # Latest Ref Range: 0.1 - 1.0 K/uL 0.5  Eosinophils Absolute Latest Ref Range: 0.0 - 0.7 K/uL 0.2  Basophils Absolute Latest Ref Range: 0.0 - 0.1 K/uL 0.0  CEA Latest Ref Range: 0.0 - 4.7 ng/mL 7.2 (H)   Results for April Wade, April Wade (MRN [id number]) as of 11/29/2015 11:41  Ref. Range 08/30/2014 11:15 09/20/2014 09:43 11/23/2014 12:29 05/23/2015 11:58 11/26/2015 09:58  CEA Latest Ref Range: 0.0 - 4.7 ng/mL 6.7 (H) 7.5 (H) 8.2 (H) 6.0 (H) 7.2 (H)     RADIOGRAPHIC STUDIES:   ASSESSMENT:  1. 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 Resection margins were clear and she also had associated appendectomy which was negative for pathology. She had resection on 11/21/2010.  2. Abdominal  wall mass, s/p excision by Dr. Romona Curls on 08/03/2011, negative for malignancy.  3. Tobacco Abuse 4. Hypokalemia 5. Iron deficiency 6. Chronically elevated CEA since 11/22/2639 , uncertain significance 7. Subcutaneous facial lesion, excised by Dr. Benjamine Mola 8. Hypokalemia  THERAPY PLAN:   She has been a chronic smoker but is had normal CEA levels. It is somewhat concerning that her CEA has risen and I have discussed this with her. I am uncertain of the significance. She had CT imaging in December, I have arranged for repeat imaging in June but she did not do this.  We have discussed in the past referral back for another C-scope, she continues to decline referral but today notes she believes she is due soon for repeat colonoscopy with Dr. Laural Wade. Although Rossi states her appetite is normal, she has lost three pounds since her last visit in March. I have re-ordered CT imaging. Will continue to follow NCCN guidelines.   We discussed smoking cessation. I offered her multiple options to discontinue including classes, medications. She currently feels it is too difficult to stop. We discussed the benefits of smoking cessation.  Potassium will be replaced at 40 meq po bid and rechecked in one week.   We will continue to follow NCCN guidelines for surveillance: NCCN guidelines for Stage II/III Colon Cancer are as follows:  1. H+P every 3-6 months for 2 years and then every 6 months for a total of 5 years.  2. CEA every 3-6 months for 2 years and then every 6 months for a total of 5 years (completing in fall 2017)  3. Colonoscopy at 1 year and then as indicated (except if no preoperative colonoscopy due to obstructing lesion, colonoscopy in 3-6 months)   A. If advanced adenoma, then repeat in 1 year   B. If no advanced adenoma, repeat in 3 years, then every 5 years.   4. CT CAP annually for up to 5 years for patients at high risk for recurrence   Will schedule follow-up in 6 months.   All questions were  answered. The patient knows to call the clinic with any problems, questions or concerns. We can certainly see the patient much sooner if necessary.  This document serves as a record of services personally performed by Ancil Linsey, MD. It was created on her behalf by Elmyra Ricks, a trained medical scribe. The creation of this record is based on the scribe's personal observations and  the provider's statements to them. This document has been checked and approved by the attending provider.   I have reviewed the above documentation for accuracy and completeness and I agree with the above. This note was electronically signed.  Molli Hazard, MD

## 2015-11-26 NOTE — Patient Instructions (Addendum)
April Wade at [facility] Discharge Instructions  RECOMMENDATIONS MADE BY THE CONSULTANT AND ANY TEST RESULTS WILL BE SENT TO YOUR REFERRING PHYSICIAN.  You saw Dr. Whitney Muse today. Follow up in 6 months with [name] with lab work. CT scan- will call you with results.  Thank you for choosing [facility] at [facility] to provide your oncology and hematology care.  To afford each patient quality time with our provider, please arrive at least 15 minutes before your scheduled appointment time.   Beginning January 23rd 2017 lab work for the [facility] will be done in the  Main lab at [facility] on 1st floor. If you have a lab appointment with the [facility] please come in thru the  Main Entrance and check in at the main information desk  You need to re-schedule your appointment should you arrive 10 or more minutes late.  We strive to give you quality time with our providers, and arriving late affects you and other patients whose appointments are after yours.  Also, if you no show three or more times for appointments you may be dismissed from the clinic at the providers discretion.     Again, thank you for choosing [facility].  Our hope is that these requests will decrease the amount of time that you wait before being seen by our physicians.       _____________________________________________________________  Should you have questions after your visit to [facility], please contact our office at [phone] between the hours of 8:30 a.m. and 4:30 p.m.  Voicemails left after 4:30 p.m. will not be returned until the following business day.  For prescription refill requests, have your pharmacy contact our office.         Resources For Cancer Patients and their Caregivers ? American Cancer Society: Can assist with transportation, wigs, general needs, runs Look Good Feel Better.        9787078267 ? Cancer  Care: Provides financial assistance, online support groups, medication/co-pay assistance.  1-800-813-HOPE 325-765-7974) ? [place] Assists [place] cancer patients and their families through emotional , educational and financial support.  539 004 2458 ? [place] DSS Where to apply for food stamps, Medicaid and utility assistance. 3092794969 ? [facility]: Transportation to medical appointments. (984)468-1431 ? Social Security Administration: May apply for disability if have a Stage IV cancer. 506-036-7834 (726)329-7318 ? [employer], Disability and Transit Services: Assists with nutrition, care and transit needs. [place] Support Programs: @10RELATIVEDAYS @ > Cancer Support Group  2nd Tuesday of the month 1pm-2pm, Journey Room  > Creative Journey  3rd Tuesday of the month 1130am-1pm, Journey Room  > Look Good Feel Better  1st Wednesday of the month 10am-12 noon, Journey Room (Call [place] to register [phone])

## 2015-11-27 ENCOUNTER — Telehealth (HOSPITAL_COMMUNITY): Payer: Self-pay | Admitting: *Deleted

## 2015-11-27 ENCOUNTER — Other Ambulatory Visit (HOSPITAL_COMMUNITY): Payer: Self-pay | Admitting: *Deleted

## 2015-11-27 LAB — CEA: CEA: 7.2 ng/mL — AB (ref 0.0–4.7)

## 2015-11-27 MED ORDER — POTASSIUM CHLORIDE CRYS ER 20 MEQ PO TBCR: 40.0000 meq | EXTENDED_RELEASE_TABLET | Freq: Two times a day (BID) | ORAL | 1 refills | Status: AC

## 2015-11-27 NOTE — Telephone Encounter (Signed)
-----   Message from Patrici Ranks, MD sent at 11/26/2015  1:14 PM EDT ----- Needs potassium start 40 meq po bid. Very important she takes. Dr.P Recheck K in one week.

## 2015-11-27 NOTE — Telephone Encounter (Signed)
Pt aware that Rx sent to [facility] in [place].

## 2015-11-29 ENCOUNTER — Encounter (HOSPITAL_COMMUNITY): Payer: Self-pay | Admitting: Hematology & Oncology

## 2015-12-05 ENCOUNTER — Ambulatory Visit (INDEPENDENT_AMBULATORY_CARE_PROVIDER_SITE_OTHER): Payer: Medicaid Other | Admitting: Otolaryngology

## 2015-12-05 ENCOUNTER — Encounter (HOSPITAL_COMMUNITY): Payer: Medicaid Other

## 2015-12-05 DIAGNOSIS — C189 Malignant neoplasm of colon, unspecified: Secondary | ICD-10-CM | POA: Diagnosis not present

## 2015-12-05 LAB — COMPREHENSIVE METABOLIC PANEL
ALT: 16 U/L (ref 14–54)
ANION GAP: 6 (ref 5–15)
AST: 17 U/L (ref 15–41)
Albumin: 4.4 g/dL (ref 3.5–5.0)
Alkaline Phosphatase: 77 U/L (ref 38–126)
BUN: 13 mg/dL (ref 6–20)
CHLORIDE: 102 mmol/L (ref 101–111)
CO2: 29 mmol/L (ref 22–32)
CREATININE: 0.75 mg/dL (ref 0.44–1.00)
Calcium: 9 mg/dL (ref 8.9–10.3)
Glucose, Bld: 94 mg/dL (ref 65–99)
POTASSIUM: 4.3 mmol/L (ref 3.5–5.1)
SODIUM: 137 mmol/L (ref 135–145)
Total Bilirubin: 0.4 mg/dL (ref 0.3–1.2)
Total Protein: 7.6 g/dL (ref 6.5–8.1)

## 2015-12-06 ENCOUNTER — Telehealth (HOSPITAL_COMMUNITY): Payer: Self-pay

## 2015-12-06 NOTE — Telephone Encounter (Signed)
Returned patient phone call, Lab results all within normal limits. Potassium is normal and she was to continue her Meds till otherwise notified.

## 2015-12-10 ENCOUNTER — Ambulatory Visit (HOSPITAL_COMMUNITY): Payer: Medicaid Other

## 2015-12-10 ENCOUNTER — Telehealth (HOSPITAL_COMMUNITY): Payer: Self-pay | Admitting: Oncology

## 2015-12-10 NOTE — Telephone Encounter (Signed)
[place] to hear conversation with insurance company for approval of CT chest, abdomen, and pelvis performed. Approval was given. Approval number is [id number].  Robynn Pane, PA-C 12/10/2015 8:48 AM

## 2015-12-25 ENCOUNTER — Ambulatory Visit (HOSPITAL_COMMUNITY)
Admission: RE | Admit: 2015-12-25 | Discharge: 2015-12-25 | Disposition: A | Discharge status: Home / Self Care | Payer: Medicaid Other | Source: Ambulatory Visit | Attending: Hematology & Oncology | Admitting: Hematology & Oncology

## 2015-12-25 DIAGNOSIS — R970 Elevated carcinoembryonic antigen [CEA]: Secondary | ICD-10-CM | POA: Insufficient documentation

## 2015-12-25 DIAGNOSIS — I728 Aneurysm of other specified arteries: Secondary | ICD-10-CM | POA: Insufficient documentation

## 2015-12-25 DIAGNOSIS — K4020 Bilateral inguinal hernia, without obstruction or gangrene, not specified as recurrent: Secondary | ICD-10-CM | POA: Insufficient documentation

## 2015-12-25 DIAGNOSIS — I700 Atherosclerosis of aorta: Secondary | ICD-10-CM | POA: Insufficient documentation

## 2015-12-25 DIAGNOSIS — C189 Malignant neoplasm of colon, unspecified: Secondary | ICD-10-CM | POA: Insufficient documentation

## 2015-12-25 MED ORDER — IOPAMIDOL (ISOVUE-300) INJECTION 61%
100.0000 mL | Freq: Once | INTRAVENOUS | Status: AC | PRN
  Administered 2015-12-25: 100 mL via INTRAVENOUS

## 2016-04-29 ENCOUNTER — Other Ambulatory Visit (HOSPITAL_COMMUNITY): Payer: Self-pay | Admitting: Family Medicine

## 2016-04-29 DIAGNOSIS — Z1231 Encounter for screening mammogram for malignant neoplasm of breast: Secondary | ICD-10-CM

## 2016-05-21 ENCOUNTER — Ambulatory Visit (HOSPITAL_COMMUNITY): Payer: Medicaid Other

## 2016-05-26 ENCOUNTER — Other Ambulatory Visit (HOSPITAL_COMMUNITY): Payer: Medicaid Other

## 2016-05-26 ENCOUNTER — Ambulatory Visit (HOSPITAL_COMMUNITY): Payer: Medicaid Other | Admitting: Oncology

## 2016-05-26 NOTE — Progress Notes (Signed)
   ROS  This encounter was created in error - please disregard. 

## 2016-05-26 NOTE — Assessment & Plan Note (Deleted)
Stage IIA (T3N0M0) moderately differentiated adenocarcinoma of the colon extending into pericolonic fatty tissue and muscularis propria but no evidence for LVI; 15 nodes were all negative. S/P resection on 11/21/2010 by Dr. Romona Curls.  CEA has been elevated since June 2016, yet stable.  Last colonoscopy was on 09/27/2013 by Dr. Laural Golden.  She will be due for her next TCS in 2018.  Oncology history developed.  Labs today: CBC diff, CMET, CEA.  I personally reviewed and went over laboratory results with the patient.  The results are noted within this dictation.  Labs in 6 months: CBC diff, CMET, CEA.  Ongoing CEA elevation is of concern.  She is a smoker which does raise the upper limits of normal of CEA.  Orders are placed for CT CAP in 6 months for her annual imaging surveillance.  Will refer patient to Dr. Laural Golden for next TCS, of which will be due in July 2018.  This is important given her elevated CEA, but normal imaging.  Return in 6 months for follow-up.

## 2016-06-22 ENCOUNTER — Other Ambulatory Visit (HOSPITAL_COMMUNITY): Payer: Self-pay | Admitting: *Deleted

## 2016-06-22 DIAGNOSIS — C189 Malignant neoplasm of colon, unspecified: Secondary | ICD-10-CM

## 2016-06-22 NOTE — Progress Notes (Signed)
[place], [place] [address]   CLINIC:  Medical Oncology/Hematology  PCP:  [name], [place] [address] [phone]   REASON FOR VISIT:  Follow-up for Stage IIA (T3N0M0) adenocarcinoma of colon  CURRENT THERAPY: Surveillance per NCCN Guidelines    BRIEF ONCOLOGIC HISTORY:    Adenocarcinoma of colon ([place])   11/21/2010 Surgery    Resection of Stage IIA moderately differentiated adenocarcinoma of the colon extending into pericolonic fatty tissue and muscularis propria but no evidence for LVI; 15 nodes were all negative. Resection margins were clear and she also had associated append      07/23/2011 Imaging    CT abd/pelvis- 1.  There is a palpable subcutaneous nodule in the right upper abdominal wall adjacent to midline best seen in the sagittal image 42. Measures about 1.2 cm.  There is delayed enhancement of this nodule.  Although may be inflammatory in nature metastatic disease cannot be excluded.  Clinical correlation and further evaluation is recommended.  2.  Degenerative changes lumbar spine. 3.  No intra-abdominal mass or fluid collection is noted. 4.  No hydronephrosis or hydroureter. 5.  Atherosclerotic calcifications abdominal aorta and the iliac arteries are noted.      01/30/2013 Imaging    CT abd/pelvis- No acute findings.  No change from prior study.      09/27/2013 Procedure    Colonoscopy by Dr. Laural Golden- Examination performed to ileocolonic anastomosis located in the region of hepatic flexure under fluoroscopy. Long redundant sigmoid colon resulting in gamma loop formation which was reduced once the scope tip was in transverse colon. Two small polyps removed from sigmoid colon and submitted together(old biopsy and cold snare). Small polyp cold snare from rectosigmoid junction but was lost. Unremarkable anorectal junction.      09/28/2013 Pathology Results    Colon, polyp(s),  sigmoid - TUBULAR ADENOMA (ONE FRAGMENT) - HYPERPLASTIC POLYP. - NO EVIDENCE OF HIGH GRADE DYSPLASIA OR MALIGNANCY.      02/16/2014 Tumor Marker    Patient's tumor was tested for the following markers: CEA. Results of the tumor marker test revealed 3.7.      02/21/2014 Imaging    CT CAP- 1. No recurrent malignancy identified. 2. 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 3. Mild atelectasis inferiorly in the lingula. 4. Lumbar spondylosis and degenerative disc disease, causing mild bilateral foraminal stenosis at L5-S1.      08/21/2014 Tumor Marker    Patient's tumor was tested for the following markers: CEA. Results of the tumor marker test revealed 6.1.      08/30/2014 Imaging    CT CAP- 1. No CT findings to suggest metastatic disease involving the chest, abdomen or pelvis. 2. Mild apparent segmental narrowing of the mid sigmoid colon could be due to contraction. Could not exclude a stricture. I do not see a mass or pelvic adenopathy. 3. Stable atherosclerotic calcifications involving the thoracic and abdominal aortas. 4. Stable mediastinal and hilar lymph nodes.      08/30/2014 Tumor Marker    Patient's tumor was tested for the following markers: CEA. Results of the tumor marker test revealed 6.7.      09/20/2014 Tumor Marker    Patient's tumor was tested for the following markers: CEA. Results of the tumor marker test revealed 7.5.      11/23/2014 Tumor Marker    Patient's tumor was tested for the following markers: CEA. Results of the tumor  marker test revealed 8.2.      02/21/2015 Imaging    CT CAP- 1. No evidence of metastatic disease in the chest, abdomen or pelvis. 2. No evidence of local tumor recurrence at the ileocolic anastomosis. 3. Stable scattered mild cylindrical bronchiectasis in the mid to lower lungs bilaterally, with associated minimal tree-in-bud type opacities. 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      05/23/2015 Tumor Marker    Patient's tumor was tested for the following markers: CEA. Results of the tumor marker test revealed 6.0.      11/26/2015 Tumor Marker    Patient's tumor was tested for the following markers: CEA. Results of the tumor marker test revealed 7.2.      12/25/2015 Imaging    CT CAP- 1. No evidence of metastatic disease. 2.  Aortic atherosclerosis (ICD10-170.0).  Celiac trunk aneurysm.       HISTORY OF PRESENT ILLNESS:  (From Kirby Crigler, PA-C's last visit on 05/26/16)     INTERVAL HISTORY:  Ms. Valley 61 y.o. female returns for routine follow-up for history of colon cancer.   Reports that overall she has been feeling very well.  Her appetite and energy levels are at 100% of her baseline.  Denies pain at present. Endorses occasional right abdominal "gripping pain"; the pain is self-limiting but is new for her within the past few weeks.  Denies any blood in her stools or dark/tarry stools.  Her last colonoscopy with Dr. Laural Golden was in 09/2013 and she knows she is due this year for repeat exam.    She has chronic cough, which she attributes to COPD. She continues to smoke about 1/2-3/4 ppd. She has been smoking for over 42 years; when she was smoking more heavily, she smoked 2-3 packs per day for about 1 year.  Anxiety and stress are the biggest triggers for her to use tobacco products; she shares with me that her husband recently had to have quadruple bypass heart surgery and then had to recently have 2 stents replaced.    She has "always had a hard time sleeping."  She has never tried any medications to help her sleep.   For fun, she enjoys spending time with her grandchildren who are 83 years old and 8 months.  She stays active cleaning her house and playing with her grandchildren.     REVIEW OF SYSTEMS:  Review of Systems  Constitutional: Negative for appetite change, chills, fatigue and fever.  HENT:   Negative.   Eyes: Negative.   Respiratory: Positive for cough (chronic; she attributes this to COPD).   Cardiovascular: Positive for leg swelling ("only when I stand too much" ). Negative for chest pain.  Gastrointestinal: Positive for abdominal pain (intermitting "gripping" right abdominal pain) and diarrhea (chronic since bowel resection surgery ). Negative for blood in stool, constipation, nausea and vomiting.  Endocrine: Negative.   Genitourinary: Negative.  Negative for dysuria, hematuria and vaginal bleeding.   Musculoskeletal: Negative.   Skin: Negative.  Negative for rash.  Neurological: Negative for dizziness and headaches.  Hematological: Negative.   Psychiatric/Behavioral: Positive for sleep disturbance.     PAST MEDICAL/SURGICAL HISTORY:  Past Medical History:  Diagnosis Date  . Adenocarcinoma ([place])    abdominal wall  . Adenocarcinoma of colon ([place]) 12/16/2011   Stage IIA moderately differentiated adenocarcinoma of the colon extending into pericolonic fatty tissue and muscularis propria but no evidence for LVI; 15 nodes were all negative. Resection  margins were clear and she also had associated appendectomy which was negative for pathology. She had resection on 11/21/2010.   . Arthritis   . Asthma   . Borderline diabetic   . COPD (chronic obstructive pulmonary disease) ([place])   . Dental caries   . Headache, migraine   . Shortness of breath   . Skin cancer of face    colon dx 10/2010   Past Surgical History:  Procedure Laterality Date  . ABDOMINAL WALL DEFECT REPAIR  08/03/2011   Procedure: REPAIR ABDOMINAL WALL;  Surgeon: Scherry Ran, MD;  Location: [facility];  Service: General;  Laterality: N/A;  . COLON SURGERY    . COLONOSCOPY  10/29/2010   Procedure: COLONOSCOPY;  Surgeon: Rogene Houston, MD;  Location: [facility];  Service: Endoscopy;  Laterality: N/A;  . COLONOSCOPY N/A 02/15/2013   Procedure: COLONOSCOPY;  Surgeon: Rogene Houston, MD;  Location[facility];  Service: Endoscopy;  Laterality: N/A;  1030-moved to 1120 Ann to notify pt  . COLONOSCOPY N/A 09/27/2013   Procedure: COLONOSCOPY;  Surgeon: Rogene Houston, MD;  [facility];  Service: Endoscopy;  Laterality: N/A;  730 -- with fluoro  . COLOSTOMY REVISION  11/21/2010   Procedure: COLON RESECTION RIGHT;  Surgeon: Scherry Ran;  Location: [facility];  Service: General;  Laterality: Right;  Right Colectomy, Frozen section.  Pathology notified 11/20/2010  . FLEXIBLE BRONCHOSCOPY Bilateral 04/03/2015   Procedure: FLEXIBLE BRONCHOSCOPY WITH BX;  Surgeon: Sinda Du, MD;  [facility];  Service: Cardiopulmonary;  Laterality: Bilateral;  . MASS EXCISION  08/03/2011   Procedure: EXCISION MASS;  Surgeon: Scherry Ran, MD;  Location: [facility];  Service: General;  Laterality: N/A;  Abdominal Wall Mass with repair of abdominal wall defect with proceed mesh  . MASS EXCISION Left 05/27/2015   Procedure: EXCISION OF LEFT FACIAL  MASS;  Surgeon: Leta Baptist, MD;  Location: [facility];  Service: ENT;  Laterality: Left;  . SKIN CANCER EXCISION     [place]  . SKIN CANCER EXCISION  2015   skin cancer removed from nose     SOCIAL HISTORY:  Social History   Social History  . Marital status: Divorced    Spouse name: N/A  . Number of children: N/A  . Years of education: N/A   Occupational History  . Not on file.   Social History Main Topics  . Smoking status: Current Every Day Smoker    Packs/day: 0.50    Years: 43.00    Types: Cigarettes  . Smokeless tobacco: Never Used  . Alcohol use No  . Drug use: No  . Sexual activity: Yes    Birth control/ protection: Post-menopausal   Other Topics Concern  . Not on file   Social History Narrative  . No narrative on file    FAMILY HISTORY:  Family History  Problem Relation Age of Onset  . Hypertension Mother   . Diabetes Father   . Anesthesia problems Neg Hx   . Hypotension Neg Hx   . Malignant  hyperthermia Neg Hx   . Pseudochol deficiency Neg Hx     CURRENT MEDICATIONS:  Outpatient Encounter Prescriptions as of 06/23/2016  Medication Sig  . albuterol (PROVENTIL HFA;VENTOLIN HFA) 108 (90 BASE) MCG/ACT inhaler Inhale 1 puff into the lungs every 6 (six) hours as needed for wheezing or shortness of breath.  Marland Kitchen albuterol (PROVENTIL) (2.5 MG/3ML) 0.083% nebulizer solution Take 2.5 mg by  nebulization every 6 (six) hours as needed for wheezing or shortness of breath.  . cycloSPORINE (RESTASIS) 0.05 % ophthalmic emulsion Place 1 drop into both eyes 2 (two) times daily.   . fish oil-omega-3 fatty acids 1000 MG capsule Take 1 g by mouth daily.   . Iron Polysacch Cmplx-B12-FA (FERREX 150 FORTE) 150-0.025-1 MG CAPS Take 1 capsule by mouth daily.  . meloxicam (MOBIC) 15 MG tablet Take 15 mg by mouth daily.   . potassium chloride SA (K-DUR,KLOR-CON) 20 MEQ tablet Take 2 tablets (40 mEq total) by mouth 2 (two) times daily. (Patient taking differently: Take 40 mEq by mouth daily. )  . [DISCONTINUED] Cholecalciferol (VITAMIN D) 2000 UNITS tablet Take 2,000 Units by mouth daily.    . [DISCONTINUED] oxyCODONE-acetaminophen (ROXICET) 5-325 MG tablet Take 1 tablet by mouth every 4 (four) hours as needed for severe pain. (Patient not taking: Reported on 11/26/2015)  . [DISCONTINUED] potassium chloride SA (K-DUR,KLOR-CON) 20 MEQ tablet Take 1 tablet (20 mEq total) by mouth daily. (Patient not taking: Reported on 11/26/2015)   No facility-administered encounter medications on file as of 06/23/2016.     ALLERGIES:  No Known Allergies   PHYSICAL EXAM:  ECOG Performance status: 1 - Symptomatic, but independent.   Vitals:   06/23/16 1225  BP: (!) 119/50  Pulse: 60  Resp: 16  Temp: 98.2 F (36.8 C)   Filed Weights   06/23/16 1225  Weight: 120 lb 6.4 oz (54.6 kg)    Physical Exam  Constitutional: She is oriented to person, place, and time and well-developed, well-nourished, and in no distress.    HENT:  Head: Normocephalic.  Mouth/Throat: Oropharynx is clear and moist. No oropharyngeal exudate.  Poor dentition  Eyes: Conjunctivae are normal. Pupils are equal, round, and reactive to light. No scleral icterus.  Neck: Normal range of motion. Neck supple.  Cardiovascular: Normal rate, regular rhythm and normal heart sounds.   Pulmonary/Chest: Effort normal and breath sounds normal. No respiratory distress. She has no wheezes.  Abdominal: Soft. Bowel sounds are normal. There is tenderness (mild tenderness noted to RLQ (adjacent to umbilicus) ). There is no rebound and no guarding.  Superficial palpable lymphadenopathy to midline gastric area; also chain of lymph nodes palpable from right upper abd and extending below umbilicus.   Musculoskeletal: Normal range of motion. She exhibits no edema.  Lymphadenopathy:    She has no cervical adenopathy.  Neurological: She is alert and oriented to person, place, and time. Gait normal.  Skin: Skin is warm and dry. No rash noted.  Psychiatric: Mood, memory, affect and judgment normal.  Nursing note and vitals reviewed.    LABORATORY DATA:  I have reviewed the labs as listed.  CBC    Component Value Date/Time   WBC 6.3 06/23/2016 1144   RBC 3.96 06/23/2016 1144   HGB 12.2 06/23/2016 1144   HCT 36.6 06/23/2016 1144   PLT 229 06/23/2016 1144   MCV 92.4 06/23/2016 1144   MCH 30.8 06/23/2016 1144   MCHC 33.3 06/23/2016 1144   RDW 14.8 06/23/2016 1144   LYMPHSABS 2.7 06/23/2016 1144   MONOABS 0.4 06/23/2016 1144   EOSABS 0.2 06/23/2016 1144   BASOSABS 0.0 06/23/2016 1144   CMP Latest Ref Rng & Units 06/23/2016 12/05/2015 11/26/2015  Glucose 65 - 99 mg/dL 92 94 115(H)  BUN 6 - 20 mg/dL 12 13 12   Creatinine 0.44 - 1.00 mg/dL 0.80 0.75 0.85  Sodium 135 - 145 mmol/L 139 137 142  Potassium 3.5 -  5.1 mmol/L 3.2(L) 4.3 2.9(L)  Chloride 101 - 111 mmol/L 103 102 102  CO2 22 - 32 mmol/L 30 29 30   Calcium 8.9 - 10.3 mg/dL 9.1 9.0 9.0  Total  Protein 6.5 - 8.1 g/dL 7.3 7.6 6.8  Total Bilirubin 0.3 - 1.2 mg/dL 0.6 0.4 0.3  Alkaline Phos 38 - 126 U/L 73 77 67  AST 15 - 41 U/L 18 17 17   ALT 14 - 54 U/L 13(L) 16 12(L)     PENDING LABS:  CEA pending.    DIAGNOSTIC IMAGING:  Last CT chest/abd/pelvis: 12/25/15      PATHOLOGY:  Last colonoscopy path: 09/27/13         ASSESSMENT & PLAN:   Stage IIA (T3N0M0) adenocarcinoma of colon:  -Diagnosed in 11/2010; s/p surgical resection; she has been without evidence of disease since that time.  -Last colonoscopy 09/27/13 revealed tubular adenoma; recommended repeat colonoscopy in 3 years with Dr. Laural Golden.  -Refer back to Dr. Laural Golden for colonoscopy consideration in 09/2016 as recommended.  -CEA pending; previous CEAs have been elevated likely in setting of current cigarette smoking.  -Will obtain CT abd/pelvis given palpable lymphadenopathy (see below). If imaging is negative for recurrent disease, then will return to cancer center in 6 months for continued follow-up.      Superficial palpable abdominal lymph nodes with right lower quadrant pain:  -Endorses new, "gripping" right lower intermittent abdominal pain; mild tenderness noted on exam. No rebound or guarding.  She has had chronic diarrhea since her surgical resection surgery.  -Large chain of lymph nodes palpable beginning in epigastric region, extending to the right upper quadrant towards umbilicus.  Last CT chest/abd/pelvis in 12/2015; no documentation of abdominal lymphadenopathy.  -Could represent calcified nodes/benign etiology. However, given patient's history of colon cancer, will obtain CT abd/pelvis with contrast to rule out new/recurrent malignancy.   Mild hypokalemia:  -Potassium mildly low at 3.2 today; she takes oral potassium supplementation. States that she takes "potassium pill twice per day." She is unsure of the mEq of her prescription; this is managed by another provider.  Recommended she eat foods rich  in potassium and continue taking her potassium supplements.   Insomnia:  -Recommended she try OTC Benadryl 25-50 mg/night. Discussed possible side effects and that Benadryl is not habit-forming and generally very well-tolerated. She will give this a try.   Health maintenance/Wellness promotion:  -Recommended healthy diet and exercise.  -Did not get flu shot this year; "I usually do, I just didn't this year."   -Annual screening mammogram was due in 05/2016; I offered to place orders for her mammogram to be done here at [facility] as soon as they are able and she agreed with this plan; orders placed today.  -Recommended continued follow-up with PCP and other specialists.   Smoking cessation:  -We discussed the pathophysiology of nicotine dependence and different strategies to stop smoking.  The gold standard of tobacco cessation is nicotine replacement (with nicotine patches & gum/lozenges) or Varenicline (Chantix).  We discussed that the typical craving/urge to smoke lasts about 3-5 minutes; her biggest trigger(s) to use cigarettes is stress, particularly in the setting of her husband recently requiring 2 significant heart surgeries.  Recommended she use a piece of nicotine gum or a lozenge when she has a craving. I gave her instructions on how to use these nicotine replacement products. Also recommended she start Nicotine patch 21 mg/day and gave her instructions for use as well.   Ms.Buchmann  understands that  data suggests that "cold [place]" is the least effective way to stop using tobacco products.  Having a clear "quit plan" is much more effective and requires a step-wise approach with continued support from a tobacco treatment specialist.  I encouraged her to reach out to me if she has further questions or interest in her continued cessation efforts.  Greater than 5 minutes was spent in smoking cessation counseling with this patient.         Dispo:  -CT abd/pelvis with contrast sometime within  the next week or 2 for abdominal lymphadenopathy.  -Annual screening mammogram overdue; orders placed today.  -If imaging negative, return to cancer center in 6 months for continued follow-up and labs.    All questions were answered to patient's stated satisfaction. Encouraged patient to call with any new concerns or questions before her next visit to the cancer center and we can certain see her sooner, if needed.    Plan of care discussed with Dr. Talbert Cage, who agrees with the above aforementioned.     Orders placed this encounter:  Orders Placed This Encounter  Procedures  . CT ABDOMEN PELVIS W CONTRAST  . MM SCREENING BREAST TOMO BILATERAL      Mike Craze, NP [place] [phone]

## 2016-06-23 ENCOUNTER — Encounter (INDEPENDENT_AMBULATORY_CARE_PROVIDER_SITE_OTHER): Payer: Self-pay | Admitting: *Deleted

## 2016-06-23 ENCOUNTER — Other Ambulatory Visit (HOSPITAL_COMMUNITY): Payer: Self-pay | Admitting: Adult Health

## 2016-06-23 ENCOUNTER — Encounter (HOSPITAL_COMMUNITY): Payer: Medicaid Other | Attending: Adult Health | Admitting: Adult Health

## 2016-06-23 ENCOUNTER — Encounter (HOSPITAL_COMMUNITY): Payer: Self-pay | Admitting: Adult Health

## 2016-06-23 ENCOUNTER — Encounter (HOSPITAL_COMMUNITY): Payer: Medicaid Other | Attending: Oncology

## 2016-06-23 VITALS — BP 119/50 | HR 60 | Temp 98.2°F | Resp 16 | Ht 67.25 in | Wt 120.4 lb

## 2016-06-23 DIAGNOSIS — R970 Elevated carcinoembryonic antigen [CEA]: Secondary | ICD-10-CM | POA: Diagnosis not present

## 2016-06-23 DIAGNOSIS — Z720 Tobacco use: Secondary | ICD-10-CM | POA: Diagnosis not present

## 2016-06-23 DIAGNOSIS — G4700 Insomnia, unspecified: Secondary | ICD-10-CM

## 2016-06-23 DIAGNOSIS — R1031 Right lower quadrant pain: Secondary | ICD-10-CM

## 2016-06-23 DIAGNOSIS — E876 Hypokalemia: Secondary | ICD-10-CM | POA: Diagnosis not present

## 2016-06-23 DIAGNOSIS — C189 Malignant neoplasm of colon, unspecified: Secondary | ICD-10-CM

## 2016-06-23 DIAGNOSIS — R590 Localized enlarged lymph nodes: Secondary | ICD-10-CM

## 2016-06-23 DIAGNOSIS — Z1231 Encounter for screening mammogram for malignant neoplasm of breast: Secondary | ICD-10-CM

## 2016-06-23 DIAGNOSIS — Z85038 Personal history of other malignant neoplasm of large intestine: Secondary | ICD-10-CM

## 2016-06-23 DIAGNOSIS — R197 Diarrhea, unspecified: Secondary | ICD-10-CM

## 2016-06-23 LAB — CBC WITH DIFFERENTIAL/PLATELET
BASOS ABS: 0 10*3/uL (ref 0.0–0.1)
Basophils Relative: 0 %
EOS ABS: 0.2 10*3/uL (ref 0.0–0.7)
EOS PCT: 3 %
HCT: 36.6 % (ref 36.0–46.0)
Hemoglobin: 12.2 g/dL (ref 12.0–15.0)
Lymphocytes Relative: 43 %
Lymphs Abs: 2.7 10*3/uL (ref 0.7–4.0)
MCH: 30.8 pg (ref 26.0–34.0)
MCHC: 33.3 g/dL (ref 30.0–36.0)
MCV: 92.4 fL (ref 78.0–100.0)
MONO ABS: 0.4 10*3/uL (ref 0.1–1.0)
Monocytes Relative: 7 %
Neutro Abs: 2.9 10*3/uL (ref 1.7–7.7)
Neutrophils Relative %: 47 %
PLATELETS: 229 10*3/uL (ref 150–400)
RBC: 3.96 MIL/uL (ref 3.87–5.11)
RDW: 14.8 % (ref 11.5–15.5)
WBC: 6.3 10*3/uL (ref 4.0–10.5)

## 2016-06-23 LAB — COMPREHENSIVE METABOLIC PANEL
ALT: 13 U/L — ABNORMAL LOW (ref 14–54)
AST: 18 U/L (ref 15–41)
Albumin: 4.1 g/dL (ref 3.5–5.0)
Alkaline Phosphatase: 73 U/L (ref 38–126)
Anion gap: 6 (ref 5–15)
BUN: 12 mg/dL (ref 6–20)
CHLORIDE: 103 mmol/L (ref 101–111)
CO2: 30 mmol/L (ref 22–32)
Calcium: 9.1 mg/dL (ref 8.9–10.3)
Creatinine, Ser: 0.8 mg/dL (ref 0.44–1.00)
Glucose, Bld: 92 mg/dL (ref 65–99)
POTASSIUM: 3.2 mmol/L — AB (ref 3.5–5.1)
SODIUM: 139 mmol/L (ref 135–145)
Total Bilirubin: 0.6 mg/dL (ref 0.3–1.2)
Total Protein: 7.3 g/dL (ref 6.5–8.1)

## 2016-06-23 NOTE — Patient Instructions (Addendum)
[name] at [facility] Discharge Instructions  RECOMMENDATIONS MADE BY THE CONSULTANT AND ANY TEST RESULTS WILL BE SENT TO YOUR REFERRING PHYSICIAN.  You were seen today by Mike Craze NP. Try taking Benadryl 25-50mg  for sleep. CT and Mammogram in 1-2 weeks. Colonoscopy due in July. Return in 6 months for labs and follow up.    Thank you for choosing [place] at [facility] to provide your oncology and hematology care.  To afford each patient quality time with our provider, please arrive at least 15 minutes before your scheduled appointment time.    If you have a lab appointment with the [facility] please come in thru the  Main Entrance and check in at the main information desk  You need to re-schedule your appointment should you arrive 10 or more minutes late.  We strive to give you quality time with our providers, and arriving late affects you and other patients whose appointments are after yours.  Also, if you no show three or more times for appointments you may be dismissed from the clinic at the providers discretion.     Again, thank you for choosing [facility].  Our hope is that these requests will decrease the amount of time that you wait before being seen by our physicians.       _____________________________________________________________  Should you have questions after your visit to [facility], please contact our office at [phone] between the hours of 8:30 a.m. and 4:30 p.m.  Voicemails left after 4:30 p.m. will not be returned until the following business day.  For prescription refill requests, have your pharmacy contact our office.       Resources For Cancer Patients and their Caregivers ? American Cancer Society: Can assist with transportation, wigs, general needs, runs Look Good Feel Better.        940-785-2108 ? Cancer Care: Provides financial assistance, online support  groups, medication/co-pay assistance.  1-800-813-HOPE 603 437 7039) ? [facility] Assists [place] cancer patients and their families through emotional , educational and financial support.  508-316-0846 ? [place] DSS Where to apply for food stamps, Medicaid and utility assistance. 915 737 0153 ? [facility]: Transportation to medical appointments. 806-519-2572 ? Social Security Administration: May apply for disability if have a Stage IV cancer. 732-362-5811 9414001450 ? [employer], Disability and Transit Services: Assists with nutrition, care and transit needs. [facility] Support Programs: @10RELATIVEDAYS @ > Cancer Support Group  2nd Tuesday of the month 1pm-2pm, Journey Room  > Creative Journey  3rd Tuesday of the month 1130am-1pm, Journey Room  > Look Good Feel Better  1st Wednesday of the month 10am-12 noon, Journey Room (Call [facility] to register [phone])

## 2016-06-24 LAB — CEA: CEA: 7.6 ng/mL — ABNORMAL HIGH (ref 0.0–4.7)

## 2016-07-06 ENCOUNTER — Ambulatory Visit (HOSPITAL_COMMUNITY)
Admission: RE | Admit: 2016-07-06 | Discharge: 2016-07-06 | Disposition: A | Discharge status: Home / Self Care | Payer: Medicaid Other | Source: Ambulatory Visit | Attending: Adult Health | Admitting: Adult Health

## 2016-07-06 ENCOUNTER — Ambulatory Visit (HOSPITAL_COMMUNITY): Payer: Medicaid Other

## 2016-07-06 DIAGNOSIS — R590 Localized enlarged lymph nodes: Secondary | ICD-10-CM | POA: Insufficient documentation

## 2016-07-06 DIAGNOSIS — K4020 Bilateral inguinal hernia, without obstruction or gangrene, not specified as recurrent: Secondary | ICD-10-CM | POA: Diagnosis not present

## 2016-07-06 DIAGNOSIS — R970 Elevated carcinoembryonic antigen [CEA]: Secondary | ICD-10-CM | POA: Diagnosis not present

## 2016-07-06 DIAGNOSIS — Z1231 Encounter for screening mammogram for malignant neoplasm of breast: Secondary | ICD-10-CM | POA: Diagnosis present

## 2016-07-06 DIAGNOSIS — C189 Malignant neoplasm of colon, unspecified: Secondary | ICD-10-CM | POA: Insufficient documentation

## 2016-07-06 DIAGNOSIS — R1031 Right lower quadrant pain: Secondary | ICD-10-CM | POA: Diagnosis not present

## 2016-07-06 DIAGNOSIS — I700 Atherosclerosis of aorta: Secondary | ICD-10-CM | POA: Insufficient documentation

## 2016-07-06 MED ORDER — IOPAMIDOL (ISOVUE-300) INJECTION 61%
100.0000 mL | Freq: Once | INTRAVENOUS | Status: AC | PRN
  Administered 2016-07-06: 100 mL via INTRAVENOUS

## 2016-10-12 ENCOUNTER — Encounter (INDEPENDENT_AMBULATORY_CARE_PROVIDER_SITE_OTHER): Payer: Self-pay | Admitting: *Deleted

## 2016-10-22 ENCOUNTER — Other Ambulatory Visit (INDEPENDENT_AMBULATORY_CARE_PROVIDER_SITE_OTHER): Payer: Self-pay | Admitting: *Deleted

## 2016-10-22 DIAGNOSIS — Z85038 Personal history of other malignant neoplasm of large intestine: Secondary | ICD-10-CM | POA: Insufficient documentation

## 2016-10-22 DIAGNOSIS — Z860100 Personal history of colon polyps, unspecified: Secondary | ICD-10-CM | POA: Insufficient documentation

## 2016-11-19 ENCOUNTER — Telehealth (INDEPENDENT_AMBULATORY_CARE_PROVIDER_SITE_OTHER): Payer: Self-pay | Admitting: *Deleted

## 2016-11-19 ENCOUNTER — Encounter (INDEPENDENT_AMBULATORY_CARE_PROVIDER_SITE_OTHER): Payer: Self-pay | Admitting: *Deleted

## 2016-11-19 NOTE — Telephone Encounter (Signed)
Patient needs trilyte 

## 2016-11-20 MED ORDER — PEG 3350-KCL-NA BICARB-NACL 420 G PO SOLR: 4000.0000 mL | Freq: Once | ORAL | 0 refills | Status: AC

## 2016-12-09 ENCOUNTER — Encounter (INDEPENDENT_AMBULATORY_CARE_PROVIDER_SITE_OTHER): Payer: Self-pay | Admitting: *Deleted

## 2016-12-09 ENCOUNTER — Telehealth (INDEPENDENT_AMBULATORY_CARE_PROVIDER_SITE_OTHER): Payer: Self-pay | Admitting: *Deleted

## 2016-12-09 NOTE — Telephone Encounter (Signed)
Referring MD/PCP: golding   Procedure: tcs  Reason/Indication:  Colon ca, hx polyps  Has patient had this procedure before?  Yes, 09/2013  If so, when, by whom and where?    Is there a family history of colon cancer?    Who?  What age when diagnosed?    Is patient diabetic?   borderline      Does patient have prosthetic heart valve or mechanical valve?  no  Do you have a pacemaker?  no  Has patient ever had endocarditis? no  Has patient had joint replacement within last 12 months?  no  Does patient tend to be constipated or take laxatives? no  Does patient have a history of alcohol/drug use?  no  Is patient on Coumadin, Plavix and/or Aspirin? no  Medications: see epic  Allergies: see epic  Medication Adjustment per Dr Laural Golden: iron 10 days   Procedure date & time: 12/28/16 at 855

## 2016-12-09 NOTE — Telephone Encounter (Signed)
agree

## 2016-12-23 ENCOUNTER — Other Ambulatory Visit (HOSPITAL_COMMUNITY): Payer: Medicaid Other

## 2016-12-23 ENCOUNTER — Ambulatory Visit (HOSPITAL_COMMUNITY): Payer: Medicaid Other | Admitting: Adult Health

## 2016-12-23 ENCOUNTER — Ambulatory Visit (HOSPITAL_COMMUNITY): Payer: Medicaid Other | Admitting: Oncology

## 2016-12-24 ENCOUNTER — Other Ambulatory Visit (HOSPITAL_COMMUNITY): Payer: Self-pay | Admitting: *Deleted

## 2016-12-24 DIAGNOSIS — C189 Malignant neoplasm of colon, unspecified: Secondary | ICD-10-CM

## 2016-12-25 ENCOUNTER — Encounter (HOSPITAL_COMMUNITY): Payer: Medicaid Other | Attending: Hematology and Oncology | Admitting: Adult Health

## 2016-12-25 ENCOUNTER — Other Ambulatory Visit (HOSPITAL_COMMUNITY): Payer: Medicaid Other

## 2016-12-25 NOTE — Progress Notes (Deleted)
April Wade, [name] [id number]   CLINIC:  Medical Oncology/Hematology  PCP:  [name], [name] [address] [phone]   REASON FOR VISIT:  Follow-up for Stage IIA (T3N0M0) adenocarcinoma of colon  CURRENT THERAPY: Surveillance per NCCN Guidelines    BRIEF ONCOLOGIC HISTORY:    Adenocarcinoma of colon ([name])   11/21/2010 Surgery    Resection of Stage IIA moderately differentiated adenocarcinoma of the colon extending into pericolonic fatty tissue and muscularis propria but no evidence for LVI; 15 nodes were all negative. Resection margins were clear and she also had associated append      07/23/2011 Imaging    CT abd/pelvis- 1.  There is a palpable subcutaneous nodule in the right upper abdominal wall adjacent to midline best seen in the sagittal image 42. Measures about 1.2 cm.  There is delayed enhancement of this nodule.  Although may be inflammatory in nature metastatic disease cannot be excluded.  Clinical correlation and further evaluation is recommended.  2.  Degenerative changes lumbar spine. 3.  No intra-abdominal mass or fluid collection is noted. 4.  No hydronephrosis or hydroureter. 5.  Atherosclerotic calcifications abdominal aorta and the iliac arteries are noted.      01/30/2013 Imaging    CT abd/pelvis- No acute findings.  No change from prior study.      09/27/2013 Procedure    Colonoscopy by Dr. Laural Golden- Examination performed to ileocolonic anastomosis located in the region of hepatic flexure under fluoroscopy. Long redundant sigmoid colon resulting in gamma loop formation which was reduced once the scope tip was in transverse colon. Two small polyps removed from sigmoid colon and submitted together(old biopsy and cold snare). Small polyp cold snare from rectosigmoid junction but was lost. Unremarkable anorectal junction.      09/28/2013 Pathology Results    Colon, polyp(s), sigmoid -  TUBULAR ADENOMA (ONE FRAGMENT) - HYPERPLASTIC POLYP. - NO EVIDENCE OF HIGH GRADE DYSPLASIA OR MALIGNANCY.      02/16/2014 Tumor Marker    Patient's tumor was tested for the following markers: CEA. Results of the tumor marker test revealed 3.7.      02/21/2014 Imaging    CT CAP- 1. No recurrent malignancy identified. 2. 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 3. Mild atelectasis inferiorly in the lingula. 4. Lumbar spondylosis and degenerative disc disease, causing mild bilateral foraminal stenosis at L5-S1.      08/21/2014 Tumor Marker    Patient's tumor was tested for the following markers: CEA. Results of the tumor marker test revealed 6.1.      08/30/2014 Imaging    CT CAP- 1. No CT findings to suggest metastatic disease involving the chest, abdomen or pelvis. 2. Mild apparent segmental narrowing of the mid sigmoid colon could be due to contraction. Could not exclude a stricture. I do not see a mass or pelvic adenopathy. 3. Stable atherosclerotic calcifications involving the thoracic and abdominal aortas. 4. Stable mediastinal and hilar lymph nodes.      08/30/2014 Tumor Marker    Patient's tumor was tested for the following markers: CEA. Results of the tumor marker test revealed 6.7.      09/20/2014 Tumor Marker    Patient's tumor was tested for the following markers: CEA. Results of the tumor marker test revealed 7.5.      11/23/2014 Tumor Marker    Patient's tumor was tested for the following markers: CEA. Results of the tumor marker  test revealed 8.2.      02/21/2015 Imaging    CT CAP- 1. No evidence of metastatic disease in the chest, abdomen or pelvis. 2. No evidence of local tumor recurrence at the ileocolic anastomosis. 3. Stable scattered mild cylindrical bronchiectasis in the mid to lower lungs bilaterally, with associated minimal tree-in-bud type opacities. 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      05/23/2015 Tumor Marker    Patient's tumor was tested for the following markers: CEA. Results of the tumor marker test revealed 6.0.      11/26/2015 Tumor Marker    Patient's tumor was tested for the following markers: CEA. Results of the tumor marker test revealed 7.2.      12/25/2015 Imaging    CT CAP- 1. No evidence of metastatic disease. 2.  Aortic atherosclerosis (ICD10-170.0).  Celiac trunk aneurysm.       HISTORY OF PRESENT ILLNESS:  (From Kirby Crigler, PA-C's last visit on 05/26/16)     INTERVAL HISTORY:  Ms. April Wade 61 y.o. female returns for routine follow-up for history of colon cancer.   Reports that overall she has been feeling very well.  Her appetite and energy levels are at 100% of her baseline.  Denies pain at present. Endorses occasional right abdominal "gripping pain"; the pain is self-limiting but is new for her within the past few weeks.  Denies any blood in her stools or dark/tarry stools.  Her last colonoscopy with Dr. Laural Golden was in 09/2013 and she knows she is due this year for repeat exam.    She has chronic cough, which she attributes to COPD. She continues to smoke about 1/2-3/4 ppd. She has been smoking for over 42 years; when she was smoking more heavily, she smoked 2-3 packs per day for about 1 year.  Anxiety and stress are the biggest triggers for her to use tobacco products; she shares with me that her husband recently had to have quadruple bypass heart surgery and then had to recently have 2 stents replaced.    She has "always had a hard time sleeping."  She has never tried any medications to help her sleep.   For fun, she enjoys spending time with her grandchildren who are 34 years old and 8 months.  She stays active cleaning her house and playing with her grandchildren.     REVIEW OF SYSTEMS:  Review of Systems  Constitutional: Negative for appetite change, chills, fatigue and fever.  HENT:  Negative.     Eyes: Negative.   Respiratory: Positive for cough (chronic; she attributes this to COPD).   Cardiovascular: Positive for leg swelling ("only when I stand too much" ). Negative for chest pain.  Gastrointestinal: Positive for abdominal pain (intermitting "gripping" right abdominal pain) and diarrhea (chronic since bowel resection surgery ). Negative for blood in stool, constipation, nausea and vomiting.  Endocrine: Negative.   Genitourinary: Negative.  Negative for dysuria, hematuria and vaginal bleeding.   Musculoskeletal: Negative.   Skin: Negative.  Negative for rash.  Neurological: Negative for dizziness and headaches.  Hematological: Negative.   Psychiatric/Behavioral: Positive for sleep disturbance.     PAST MEDICAL/SURGICAL HISTORY:  Past Medical History:  Diagnosis Date  . Adenocarcinoma ([place])    abdominal wall  . Adenocarcinoma of colon ([place]) 12/16/2011   Stage IIA moderately differentiated adenocarcinoma of the colon extending into pericolonic fatty tissue and muscularis propria but no evidence for LVI; 15 nodes were all negative. Resection  margins were clear and she also had associated appendectomy which was negative for pathology. She had resection on 11/21/2010.   . Arthritis   . Asthma   . Borderline diabetic   . COPD (chronic obstructive pulmonary disease) ([place])   . Dental caries   . Headache, migraine   . Shortness of breath   . Skin cancer of face    colon dx 10/2010   Past Surgical History:  Procedure Laterality Date  . ABDOMINAL WALL DEFECT REPAIR  08/03/2011   Procedure: REPAIR ABDOMINAL WALL;  Surgeon: Scherry Ran, MD;  Location: [facility];  Service: General;  Laterality: N/A;  . COLON SURGERY    . COLONOSCOPY  10/29/2010   Procedure: COLONOSCOPY;  Surgeon: Rogene Houston, MD;  Location: [facility];  Service: Endoscopy;  Laterality: N/A;  . COLONOSCOPY N/A 02/15/2013   Procedure: COLONOSCOPY;  Surgeon: Rogene Houston, MD;  [facility];   Service: Endoscopy;  Laterality: N/A;  1030-moved to 1120 Ann to notify pt  . COLONOSCOPY N/A 09/27/2013   Procedure: COLONOSCOPY;  Surgeon: Rogene Houston, MD;  Location[facility];  Service: Endoscopy;  Laterality: N/A;  730 -- with fluoro  . COLOSTOMY REVISION  11/21/2010   Procedure: COLON RESECTION RIGHT;  Surgeon: Scherry Ran;  Location: [facility];  Service: General;  Laterality: Right;  Right Colectomy, Frozen section.  Pathology notified 11/20/2010  . FLEXIBLE BRONCHOSCOPY Bilateral 04/03/2015   Procedure: FLEXIBLE BRONCHOSCOPY WITH BX;  Surgeon: Sinda Du, MD;  [facility]: [facility];  Service: Cardiopulmonary;  Laterality: Bilateral;  . MASS EXCISION  08/03/2011   Procedure: EXCISION MASS;  Surgeon: Scherry Ran, MD;  Location: [facility];  Service: General;  Laterality: N/A;  Abdominal Wall Mass with repair of abdominal wall defect with proceed mesh  . MASS EXCISION Left 05/27/2015   Procedure: EXCISION OF LEFT FACIAL  MASS;  Surgeon: Leta Baptist, MD;  Location: [facility];  Service: ENT;  Laterality: Left;  . SKIN CANCER EXCISION     [place]  . SKIN CANCER EXCISION  2015   skin cancer removed from nose     SOCIAL HISTORY:  Social History   Social History  . Marital status: Divorced    Spouse name: N/A  . Number of children: N/A  . Years of education: N/A   Occupational History  . Not on file.   Social History Main Topics  . Smoking status: Current Every Day Smoker    Packs/day: 0.50    Years: 43.00    Types: Cigarettes  . Smokeless tobacco: Never Used  . Alcohol use No  . Drug use: No  . Sexual activity: Yes    Birth control/ protection: Post-menopausal   Other Topics Concern  . Not on file   Social History Narrative  . No narrative on file    FAMILY HISTORY:  Family History  Problem Relation Age of Onset  . Hypertension Mother   . Diabetes Father   . Anesthesia problems Neg Hx   . Hypotension Neg Hx   . Malignant hyperthermia Neg Hx    . Pseudochol deficiency Neg Hx     CURRENT MEDICATIONS:  Outpatient Encounter Prescriptions as of 12/25/2016  Medication Sig  . albuterol (PROVENTIL HFA;VENTOLIN HFA) 108 (90 BASE) MCG/ACT inhaler Inhale 1 puff into the lungs every 6 (six) hours as needed for wheezing or shortness of breath.  Marland Kitchen albuterol (PROVENTIL) (2.5 MG/3ML) 0.083% nebulizer solution Take 2.5 mg by  nebulization every 6 (six) hours as needed for wheezing or shortness of breath.  . cycloSPORINE (RESTASIS) 0.05 % ophthalmic emulsion Place 1 drop into both eyes 2 (two) times daily.   . Iron Polysacch Cmplx-B12-FA (FERREX 150 FORTE) 150-0.025-1 MG CAPS Take 1 capsule by mouth daily. (Patient not taking: Reported on 12/22/2016)  . meloxicam (MOBIC) 15 MG tablet Take 15 mg by mouth daily.   . naproxen sodium (ANAPROX) 220 MG tablet Take 440 mg by mouth 2 (two) times daily as needed (for pain).  . potassium chloride SA (K-DUR,KLOR-CON) 20 MEQ tablet Take 2 tablets (40 mEq total) by mouth 2 (two) times daily. (Patient taking differently: Take 40 mEq by mouth daily. )   No facility-administered encounter medications on file as of 12/25/2016.     ALLERGIES:  No Known Allergies   PHYSICAL EXAM:  ECOG Performance status: 1 - Symptomatic, but independent.   There were no vitals filed for this visit. There were no vitals filed for this visit.  Physical Exam  Constitutional: She is oriented to person, place, and time and well-developed, well-nourished, and in no distress.  HENT:  Head: Normocephalic.  Mouth/Throat: Oropharynx is clear and moist. No oropharyngeal exudate.  Poor dentition  Eyes: Pupils are equal, round, and reactive to light. Conjunctivae are normal. No scleral icterus.  Neck: Normal range of motion. Neck supple.  Cardiovascular: Normal rate, regular rhythm and normal heart sounds.   Pulmonary/Chest: Effort normal and breath sounds normal. No respiratory distress. She has no wheezes.  Abdominal: Soft. Bowel  sounds are normal. There is tenderness (mild tenderness noted to RLQ (adjacent to umbilicus) ). There is no rebound and no guarding.  Superficial palpable lymphadenopathy to midline gastric area; also chain of lymph nodes palpable from right upper abd and extending below umbilicus.   Musculoskeletal: Normal range of motion. She exhibits no edema.  Lymphadenopathy:    She has no cervical adenopathy.  Neurological: She is alert and oriented to person, place, and time. Gait normal.  Skin: Skin is warm and dry. No rash noted.  Psychiatric: Mood, memory, affect and judgment normal.  Nursing note and vitals reviewed.    LABORATORY DATA:  I have reviewed the labs as listed.  CBC    Component Value Date/Time   WBC 6.3 06/23/2016 1144   RBC 3.96 06/23/2016 1144   HGB 12.2 06/23/2016 1144   HCT 36.6 06/23/2016 1144   PLT 229 06/23/2016 1144   MCV 92.4 06/23/2016 1144   MCH 30.8 06/23/2016 1144   MCHC 33.3 06/23/2016 1144   RDW 14.8 06/23/2016 1144   LYMPHSABS 2.7 06/23/2016 1144   MONOABS 0.4 06/23/2016 1144   EOSABS 0.2 06/23/2016 1144   BASOSABS 0.0 06/23/2016 1144   CMP Latest Ref Rng & Units 06/23/2016 12/05/2015 11/26/2015  Glucose 65 - 99 mg/dL 92 94 115(H)  BUN 6 - 20 mg/dL 12 13 12   Creatinine 0.44 - 1.00 mg/dL 0.80 0.75 0.85  Sodium 135 - 145 mmol/L 139 137 142  Potassium 3.5 - 5.1 mmol/L 3.2(L) 4.3 2.9(L)  Chloride 101 - 111 mmol/L 103 102 102  CO2 22 - 32 mmol/L 30 29 30   Calcium 8.9 - 10.3 mg/dL 9.1 9.0 9.0  Total Protein 6.5 - 8.1 g/dL 7.3 7.6 6.8  Total Bilirubin 0.3 - 1.2 mg/dL 0.6 0.4 0.3  Alkaline Phos 38 - 126 U/L 73 77 67  AST 15 - 41 U/L 18 17 17   ALT 14 - 54 U/L 13(L) 16 12(L)  PENDING LABS:  CEA pending.    DIAGNOSTIC IMAGING:  Last CT chest/abd/pelvis: 12/25/15      PATHOLOGY:  Last colonoscopy path: 09/27/13         ASSESSMENT & PLAN:   Stage IIA (T3N0M0) adenocarcinoma of colon:  -Diagnosed in 11/2010; s/p surgical resection; she  has been without evidence of disease since that time.  -Last colonoscopy 09/27/13 revealed tubular adenoma; recommended repeat colonoscopy in 3 years with Dr. Laural Golden.  -Refer back to Dr. Laural Golden for colonoscopy consideration in 09/2016 as recommended.  -CEA pending; previous CEAs have been elevated likely in setting of current cigarette smoking.  -Will obtain CT abd/pelvis given palpable lymphadenopathy (see below). If imaging is negative for recurrent disease, then will return to cancer center in 6 months for continued follow-up.      Superficial palpable abdominal lymph nodes with right lower quadrant pain:  -Endorses new, "gripping" right lower intermittent abdominal pain; mild tenderness noted on exam. No rebound or guarding.  She has had chronic diarrhea since her surgical resection surgery.  -Large chain of lymph nodes palpable beginning in epigastric region, extending to the right upper quadrant towards umbilicus.  Last CT chest/abd/pelvis in 12/2015; no documentation of abdominal lymphadenopathy.  -Could represent calcified nodes/benign etiology. However, given patient's history of colon cancer, will obtain CT abd/pelvis with contrast to rule out new/recurrent malignancy.   Mild hypokalemia:  -Potassium mildly low at 3.2 today; she takes oral potassium supplementation. States that she takes "potassium pill twice per day." She is unsure of the mEq of her prescription; this is managed by another provider.  Recommended she eat foods rich in potassium and continue taking her potassium supplements.   Insomnia:  -Recommended she try OTC Benadryl 25-50 mg/night. Discussed possible side effects and that Benadryl is not habit-forming and generally very well-tolerated. She will give this a try.   Health maintenance/Wellness promotion:  -Recommended healthy diet and exercise.  -Did not get flu shot this year; "I usually do, I just didn't this year."   -Annual screening mammogram was due in 05/2016; I  offered to place orders for her mammogram to be done here at [facility] as soon as they are able and she agreed with this plan; orders placed today.  -Recommended continued follow-up with PCP and other specialists.   Smoking cessation:  -We discussed the pathophysiology of nicotine dependence and different strategies to stop smoking.  The gold standard of tobacco cessation is nicotine replacement (with nicotine patches & gum/lozenges) or Varenicline (Chantix).  We discussed that the typical craving/urge to smoke lasts about 3-5 minutes; her biggest trigger(s) to use cigarettes is stress, particularly in the setting of her husband recently requiring 2 significant heart surgeries.  Recommended she use a piece of nicotine gum or a lozenge when she has a craving. I gave her instructions on how to use these nicotine replacement products. Also recommended she start Nicotine patch 21 mg/day and gave her instructions for use as well.   Ms.Hartner  understands that data suggests that "cold [place]" is the least effective way to stop using tobacco products.  Having a clear "quit plan" is much more effective and requires a step-wise approach with continued support from a tobacco treatment specialist.  I encouraged her to reach out to me if she has further questions or interest in her continued cessation efforts.  Greater than 5 minutes was spent in smoking cessation counseling with this patient.         Dispo:  -CT  abd/pelvis with contrast sometime within the next week or 2 for abdominal lymphadenopathy.  -Annual screening mammogram overdue; orders placed today.  -If imaging negative, return to cancer center in 6 months for continued follow-up and labs.    All questions were answered to patient's stated satisfaction. Encouraged patient to call with any new concerns or questions before her next visit to the cancer center and we can certain see her sooner, if needed.    Plan of care discussed with Dr. Talbert Cage, who  agrees with the above aforementioned.     Orders placed this encounter:  No orders of the defined types were placed in this encounter.     Mike Craze, NP [place] [phone]

## 2016-12-28 ENCOUNTER — Encounter (INDEPENDENT_AMBULATORY_CARE_PROVIDER_SITE_OTHER): Payer: Self-pay | Admitting: *Deleted

## 2016-12-28 ENCOUNTER — Ambulatory Visit (HOSPITAL_COMMUNITY): Admission: RE | Admit: 2016-12-28 | Payer: Medicaid Other | Source: Ambulatory Visit | Admitting: Internal Medicine

## 2016-12-28 ENCOUNTER — Encounter (HOSPITAL_COMMUNITY): Admission: RE | Payer: Self-pay | Source: Ambulatory Visit

## 2016-12-28 ENCOUNTER — Other Ambulatory Visit (INDEPENDENT_AMBULATORY_CARE_PROVIDER_SITE_OTHER): Payer: Self-pay | Admitting: *Deleted

## 2016-12-28 DIAGNOSIS — Z860100 Personal history of colon polyps, unspecified: Secondary | ICD-10-CM | POA: Insufficient documentation

## 2016-12-28 DIAGNOSIS — C189 Malignant neoplasm of colon, unspecified: Secondary | ICD-10-CM

## 2016-12-28 SURGERY — COLONOSCOPY
Anesthesia: Moderate Sedation

## 2017-01-29 ENCOUNTER — Encounter (HOSPITAL_COMMUNITY)
Admission: RE | Disposition: A | Discharge status: Home / Self Care | Payer: Self-pay | Source: Ambulatory Visit | Attending: Internal Medicine

## 2017-01-29 ENCOUNTER — Other Ambulatory Visit: Payer: Self-pay

## 2017-01-29 ENCOUNTER — Ambulatory Visit (HOSPITAL_COMMUNITY)
Admission: RE | Admit: 2017-01-29 | Discharge: 2017-01-29 | Disposition: A | Discharge status: Home / Self Care | Payer: Medicaid Other | Source: Ambulatory Visit | Attending: Internal Medicine | Admitting: Internal Medicine

## 2017-01-29 ENCOUNTER — Encounter (HOSPITAL_COMMUNITY): Payer: Self-pay | Admitting: *Deleted

## 2017-01-29 DIAGNOSIS — Z980 Intestinal bypass and anastomosis status: Secondary | ICD-10-CM | POA: Insufficient documentation

## 2017-01-29 DIAGNOSIS — M1990 Unspecified osteoarthritis, unspecified site: Secondary | ICD-10-CM | POA: Insufficient documentation

## 2017-01-29 DIAGNOSIS — Z85038 Personal history of other malignant neoplasm of large intestine: Secondary | ICD-10-CM | POA: Insufficient documentation

## 2017-01-29 DIAGNOSIS — Z79899 Other long term (current) drug therapy: Secondary | ICD-10-CM | POA: Diagnosis not present

## 2017-01-29 DIAGNOSIS — Z08 Encounter for follow-up examination after completed treatment for malignant neoplasm: Secondary | ICD-10-CM | POA: Diagnosis not present

## 2017-01-29 DIAGNOSIS — D125 Benign neoplasm of sigmoid colon: Secondary | ICD-10-CM | POA: Diagnosis not present

## 2017-01-29 DIAGNOSIS — Z85828 Personal history of other malignant neoplasm of skin: Secondary | ICD-10-CM | POA: Insufficient documentation

## 2017-01-29 DIAGNOSIS — Z860100 Personal history of colon polyps, unspecified: Secondary | ICD-10-CM | POA: Insufficient documentation

## 2017-01-29 DIAGNOSIS — R51 Headache: Secondary | ICD-10-CM | POA: Insufficient documentation

## 2017-01-29 DIAGNOSIS — C189 Malignant neoplasm of colon, unspecified: Secondary | ICD-10-CM | POA: Insufficient documentation

## 2017-01-29 DIAGNOSIS — F17210 Nicotine dependence, cigarettes, uncomplicated: Secondary | ICD-10-CM | POA: Diagnosis not present

## 2017-01-29 DIAGNOSIS — J449 Chronic obstructive pulmonary disease, unspecified: Secondary | ICD-10-CM | POA: Diagnosis not present

## 2017-01-29 DIAGNOSIS — K648 Other hemorrhoids: Secondary | ICD-10-CM | POA: Insufficient documentation

## 2017-01-29 DIAGNOSIS — K562 Volvulus: Secondary | ICD-10-CM | POA: Insufficient documentation

## 2017-01-29 DIAGNOSIS — D123 Benign neoplasm of transverse colon: Secondary | ICD-10-CM | POA: Diagnosis not present

## 2017-01-29 DIAGNOSIS — Z1211 Encounter for screening for malignant neoplasm of colon: Secondary | ICD-10-CM | POA: Diagnosis present

## 2017-01-29 HISTORY — PX: COLONOSCOPY: SHX5424

## 2017-01-29 SURGERY — COLONOSCOPY
Anesthesia: Moderate Sedation

## 2017-01-29 MED ORDER — MEPERIDINE HCL 50 MG/ML IJ SOLN
INTRAMUSCULAR | Status: DC | PRN
  Administered 2017-01-29 (×2): 25 mg via INTRAVENOUS

## 2017-01-29 MED ORDER — SODIUM CHLORIDE 0.9 % IV SOLN
INTRAVENOUS | Status: DC
  Administered 2017-01-29: 1000 mL via INTRAVENOUS

## 2017-01-29 MED ORDER — MIDAZOLAM HCL 5 MG/5ML IJ SOLN
INTRAMUSCULAR | Status: DC | PRN
  Administered 2017-01-29 (×2): 1 mg via INTRAVENOUS
  Administered 2017-01-29: 2 mg via INTRAVENOUS

## 2017-01-29 MED ORDER — SIMETHICONE 40 MG/0.6ML PO SUSP
ORAL | Status: DC | PRN
  Administered 2017-01-29: 2.5 mL

## 2017-01-29 MED ORDER — MEPERIDINE HCL 50 MG/ML IJ SOLN
INTRAMUSCULAR | Status: AC
  Filled 2017-01-29: qty 1

## 2017-01-29 MED ORDER — MIDAZOLAM HCL 5 MG/5ML IJ SOLN
INTRAMUSCULAR | Status: AC
  Filled 2017-01-29: qty 10

## 2017-01-29 NOTE — Op Note (Signed)
[facility] Patient Name: April Wade Procedure Date: 01/29/2017 9:46 AM MRN: [id number] Date of Birth: [birth year] Attending MD: Hildred Laser , MD CSN: [id number] Age: 61 Admit Type: Outpatient Procedure:                Colonoscopy Indications:              High risk colon cancer surveillance: Personal                            history of colonic polyps, High risk colon cancer                            surveillance: Personal history of colon cancer Providers:                Hildred Laser, MD, Lurline Del, RN, [place] N[place]. [place],                            Technician Referring MD:             Halford Chessman, MD Medicines:                Meperidine 50 mg IV, Midazolam 4 mg IV Complications:            No immediate complications. Estimated Blood Loss:     Estimated blood loss was minimal. Procedure:                Pre-Anesthesia Assessment:                           - Prior to the procedure, a History and Physical                            was performed, and patient medications and                            allergies were reviewed. The patient's tolerance of                            previous anesthesia was also reviewed. The risks                            and benefits of the procedure and the sedation                            options and risks were discussed with the patient.                            All questions were answered, and informed consent                            was obtained. Prior Anticoagulants: The patient                            last took previous NSAID medication 1 day prior to  the procedure. ASA Grade Assessment: II - A patient                            with mild systemic disease. After reviewing the                            risks and benefits, the patient was deemed in                            satisfactory condition to undergo the procedure.                           After obtaining informed consent, the colonoscope                             was passed under direct vision. Throughout the                            procedure, the patient's blood pressure, pulse, and                            oxygen saturations were monitored continuously. The                            [id number]) scope was introduced through the                            anus and advanced to the the ileocolonic                            anastomosis. The colonoscopy was technically                            difficult and complex due to significant looping.                            Successful completion of the procedure was aided by                            changing the patient's position and withdrawing and                            reinserting the scope. Colowrap was also used for                            the procedure. The quality of the bowel preparation                            was adequate to identify polyps 6 mm and larger in                            size. The rectum and ileocolonic anastamosis were  photographed. Scope In: 10:01:27 AM Scope Out: 10:37:00 AM Scope Withdrawal Time: 0 hours 11 minutes 19 seconds  Total Procedure Duration: 0 hours 35 minutes 33 seconds  Findings:      The perianal and digital rectal examinations were normal.      There was evidence of a prior end-to-side ileo-colonic anastomosis at       the hepatic flexure. This was patent and was characterized by healthy       appearing mucosa. The anastomosis was traversed.      Two sessile polyps were found in the sigmoid colon and splenic flexure.       The polyps were 5 mm in size. These polyps were removed with a cold       snare. Resection was complete, but the polyp tissue was only partially       retrieved to stop active bleeding, one hemostatic clip was successfully       placed (MR conditional). There was no bleeding at the end of the       procedure.      The exam was otherwise normal throughout the  examined colon.      Internal hemorrhoids were found during retroflexion. The hemorrhoids       were small. Impression:               - Patent end-to-side ileo-colonic anastomosis,                            characterized by healthy appearing mucosa.                           - Two 5 mm polyps in the sigmoid colon and at the                            splenic flexure, removed with a cold snare.                            Complete resection. Partial retrieval. Clip (MR                            conditional) was placed at sigmoid polypectomy                           - Internal hemorrhoids. Moderate Sedation:      Moderate (conscious) sedation was administered by the endoscopy nurse       and supervised by the endoscopist. The following parameters were       monitored: oxygen saturation, heart rate, blood pressure, CO2       capnography and response to care. Total physician intraservice time was       41 minutes. Recommendation:           - Patient has a contact number available for                            emergencies. The signs and symptoms of potential                            delayed complications were discussed with the  patient. Return to normal activities tomorrow.                            Written discharge instructions were provided to the                            patient.                           - Resume previous diet today.                           - Continue present medications.                           - No aspirin, ibuprofen, naproxen, or other                            non-steroidal anti-inflammatory drugs for 3 days.                           - Await pathology results.                           - Repeat colonoscopy in 5 years for surveillance. Procedure Code(s):        --- Professional ---                           [phone], Colonoscopy, flexible; with removal of                            tumor(s), polyp(s), or other lesion(s) by snare                             technique                           99152, Moderate sedation services provided by the                            same physician or other qualified health care                            professional performing the diagnostic or                            therapeutic service that the sedation supports,                            requiring the presence of an independent trained                            observer to assist in the monitoring of the                            patient's level of consciousness and physiological  status; initial 15 minutes of intraservice time,                            patient age 62 years or older                           [phone], Moderate sedation services; each additional                            15 minutes intraservice time                           [phone], Moderate sedation services; each additional                            15 minutes intraservice time Diagnosis Code(s):        --- Professional ---                           Z86.010, Personal history of colonic polyps                           Z85.038, Personal history of other malignant                            neoplasm of large intestine                           Z98.0, Intestinal bypass and anastomosis status                           D12.5, Benign neoplasm of sigmoid colon                           D12.3, Benign neoplasm of transverse colon (hepatic                            flexure or splenic flexure)                           K64.8, Other hemorrhoids CPT copyright 2016 American Medical Association. All rights reserved. The codes documented in this report are preliminary and upon coder review may  be revised to meet current compliance requirements. Hildred Laser, MD Hildred Laser, MD 01/29/2017 10:55:17 AM This report has been signed electronically. Number of Addenda: 0

## 2017-01-29 NOTE — Discharge Instructions (Signed)
No asprin or NSAIDS x 3 days  Colonoscopy, Adult, Care After This sheet gives you information about how to care for yourself after your procedure. Your health care provider may also give you more specific instructions. If you have problems or questions, contact your health care provider. What can I expect after the procedure? After the procedure, it is common to have:  A small amount of blood in your stool for 24 hours after the procedure.  Some gas.  Mild abdominal cramping or bloating.  Follow these instructions at home: General instructions   For the first 24 hours after the procedure: ? Do not drive or use machinery. ? Do not sign important documents. ? Do not drink alcohol. ? Do your regular daily activities at a slower pace than normal. ? Eat soft, easy-to-digest foods. ? Rest often.  Take over-the-counter or prescription medicines only as told by your health care provider.  It is up to you to get the results of your procedure. Ask your health care provider, or the department performing the procedure, when your results will be ready. Relieving cramping and bloating  Try walking around when you have cramps or feel bloated.  Apply heat to your abdomen as told by your health care provider. Use a heat source that your health care provider recommends, such as a moist heat pack or a heating pad. ? Place a towel between your skin and the heat source. ? Leave the heat on for 20-30 minutes. ? Remove the heat if your skin turns bright red. This is especially important if you are unable to feel pain, heat, or cold. You may have a greater risk of getting burned. Eating and drinking  Drink enough fluid to keep your urine clear or pale yellow.  Resume your normal diet as instructed by your health care provider. Avoid heavy or fried foods that are hard to digest.  Avoid drinking alcohol for as long as instructed by your health care provider. Contact a health care provider if:  You  have blood in your stool 2-3 days after the procedure. Get help right away if:  You have more than a small spotting of blood in your stool.  You pass large blood clots in your stool.  Your abdomen is swollen.  You have nausea or vomiting.  You have a fever.  You have increasing abdominal pain that is not relieved with medicine. This information is not intended to replace advice given to you by your health care provider. Make sure you discuss any questions you have with your health care provider. Document Released: 10/15/2003 Document Revised: 11/25/2015 Document Reviewed: 05/14/2015 Elsevier Interactive Patient Education  2018 [employer].  Colon Polyps Polyps are tissue growths inside the body. Polyps can grow in many places, including the large intestine (colon). A polyp may be a round bump or a mushroom-shaped growth. You could have one polyp or several. Most colon polyps are noncancerous (benign). However, some colon polyps can become cancerous over time. What are the causes? The exact cause of colon polyps is not known. What increases the risk? This condition is more likely to develop in people who:  Have a family history of colon cancer or colon polyps.  Are older than 32 or older than 45 if they are African American.  Have inflammatory bowel disease, such as ulcerative colitis or Crohn disease.  Are overweight.  Smoke cigarettes.  Do not get enough exercise.  Drink too much alcohol.  Eat a diet that is: ?  High in fat and red meat. ? Low in fiber.  Had childhood cancer that was treated with abdominal radiation.  What are the signs or symptoms? Most polyps do not cause symptoms. If you have symptoms, they may include:  Blood coming from your rectum when having a bowel movement.  Blood in your stool.The stool may look dark red or black.  A change in bowel habits, such as constipation or diarrhea.  How is this diagnosed? This condition is diagnosed with a  colonoscopy. This is a procedure that uses a lighted, flexible scope to look at the inside of your colon. How is this treated? Treatment for this condition involves removing any polyps that are found. Those polyps will then be tested for cancer. If cancer is found, your health care provider will talk to you about options for colon cancer treatment. Follow these instructions at home: Diet  Eat plenty of fiber, such as fruits, vegetables, and whole grains.  Eat foods that are high in calcium and vitamin D, such as milk, cheese, yogurt, eggs, liver, fish, and broccoli.  Limit foods high in fat, red meats, and processed meats, such as hot dogs, sausage, bacon, and lunch meats.  Maintain a healthy weight, or lose weight if recommended by your health care provider. General instructions  Do not smoke cigarettes.  Do not drink alcohol excessively.  Keep all follow-up visits as told by your health care provider. This is important. This includes keeping regularly scheduled colonoscopies. Talk to your health care provider about when you need a colonoscopy.  Exercise every day or as told by your health care provider. Contact a health care provider if:  You have new or worsening bleeding during a bowel movement.  You have new or increased blood in your stool.  You have a change in bowel habits.  You unexpectedly lose weight. This information is not intended to replace advice given to you by your health care provider. Make sure you discuss any questions you have with your health care provider. Document Released: 11/27/2003 Document Revised: 08/08/2015 Document Reviewed: 01/21/2015 Elsevier Interactive Patient Education  [employer].

## 2017-01-29 NOTE — H&P (Signed)
April Wade is an 61 y.o. female.   Chief Complaint: Patient is here for colonoscopy. HPI: Patient is a 61 year old Caucasian female who was history of colon carcinoma and multiple colonic adenomas who is here for surveillance colonoscopy.  She had right hemicolectomy in September 2012.  Last colonoscopy was in July 2015 under fluoroscopy because she has very long tortuous sigmoid colon.  He did notice a scant amount of blood after she took prep.  She has not had bleeding previously. She has good appetite.  She states she has never weighed more than 120 pounds.  She is 113 pounds now. Family history is negative for CRC.  Past Medical History:  Diagnosis Date  . 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 Adenocarcinoma of colon ([place]) 12/16/2011   Stage IIA moderately differentiated adenocarcinoma of the colon extending into pericolonic fatty tissue and muscularis propria but no evidence for LVI; 15 nodes were all negative. Resection margins were clear and she also had associated appendectomy which was negative for pathology. She had resection on 11/21/2010.   . Arthritis   . Asthma   . Borderline diabetic   . COPD (chronic obstructive pulmonary disease) ([place])   . Dental caries   . Headache, migraine   . Shortness of breath   . Skin cancer of face    colon dx 10/2010    Past Surgical History:  Procedure Laterality Date  . COLON RESECTION RIGHT Right 11/21/2010   Performed by Scherry Ran, MD at [facility]  . COLON SURGERY    . COLONOSCOPY N/A 09/27/2013   Performed by Rogene Houston, MD at [place]  . COLONOSCOPY N/A 02/15/2013   Performed by Rogene Houston, MD at [place]  . COLONOSCOPY N/A 10/29/2010   Performed by Rogene Houston, MD at [place]  . EXCISION MASS N/A 08/03/2011   Performed by Scherry Ran, MD at [facility]  . EXCISION OF LEFT FACIAL  MASS Left 05/27/2015   Performed by Leta Baptist, MD at [facility]  . FLEXIBLE BRONCHOSCOPY WITH BX  Bilateral 04/03/2015   Performed by Sinda Du, MD at [place]  . INSERTION OF MESH N/A 08/03/2011   Performed by Scherry Ran, MD at [facility]  . REPAIR ABDOMINAL WALL N/A 08/03/2011   Performed by Scherry Ran, MD at [facility]  . SKIN CANCER EXCISION     [place]  . SKIN CANCER EXCISION  2015   skin cancer removed from nose    Family History  Problem Relation Age of Onset  . Hypertension Mother   . Diabetes Father   . Coronary artery disease Brother   . Anesthesia problems Neg Hx   . Hypotension Neg Hx   . Malignant hyperthermia Neg Hx   . Pseudochol deficiency Neg Hx    Social History:  reports that she has been smoking cigarettes.  She has a 21.50 pack-year smoking history. she has never used smokeless tobacco. She reports that she does not drink alcohol or use drugs.  Allergies: No Known Allergies  Medications Prior to Admission  Medication Sig Dispense Refill  . albuterol (PROVENTIL HFA;VENTOLIN HFA) 108 (90 BASE) MCG/ACT inhaler Inhale 2 puffs 2 (two) times daily into the lungs.     Marland Kitchen albuterol (PROVENTIL) (2.5 MG/3ML) 0.083% nebulizer solution Take 2.5 mg 2 (two) times daily by nebulization.     . cycloSPORINE (RESTASIS) 0.05 % ophthalmic emulsion Place 1  drop into both eyes 2 (two) times daily.     . meloxicam (MOBIC) 15 MG tablet Take 15 mg by mouth daily.     . potassium chloride SA (K-DUR,KLOR-CON) 20 MEQ tablet Take 2 tablets (40 mEq total) by mouth 2 (two) times daily. (Patient taking differently: Take 20 mEq 2 (two) times daily by mouth. ) 60 tablet 1  . tiotropium (SPIRIVA) 18 MCG inhalation capsule Place 18 mcg daily into inhaler and inhale.    . Iron Polysacch Cmplx-B12-FA (FERREX 150 FORTE) 150-0.025-1 MG CAPS Take 1 capsule by mouth daily. (Patient not taking: Reported on 12/22/2016) 30 each 2    No results found for this or any previous visit (from the past 48 hour(s)). No results found.  ROS  Blood pressure (!) 151/74, pulse (!) 58,  temperature 97.6 F (36.4 C), temperature source Oral, resp. rate 14, height 5' 6.25" (1.683 m), weight 113 lb (51.3 kg), SpO2 97 %. Physical Exam  Constitutional:  Well-developed thin Caucasian female in NAD.  HENT:  Mouth/Throat: Oropharynx is clear and moist.  Eyes: Conjunctivae are normal. No scleral icterus.  Neck: No thyromegaly present.  Cardiovascular: Normal rate, regular rhythm and normal heart sounds.  No murmur heard. Respiratory: Effort normal and breath sounds normal.  GI:  Abdomen is flat with long midline scar.  It is soft and nontender.  Liver edge is soft.  It is 5 cm below RCM.  Span is 13 cm.    Musculoskeletal: She exhibits no edema.  Lymphadenopathy:    She has no cervical adenopathy.  Neurological: She is alert.  Skin: Skin is warm and dry.     Assessment/Plan History of colonic adenocarcinoma and colonic adenomas. Surveillance colonoscopy  Hildred Laser, MD 01/29/2017, 9:49 AM

## 2017-02-02 ENCOUNTER — Encounter (HOSPITAL_COMMUNITY): Payer: Self-pay | Admitting: Internal Medicine

## 2018-06-28 IMAGING — CT CT ABD-PELV W/ CM
2 of 5 series · 16 of 46 positions shown, 18 images · IV contrast (Isovue)
Comparison: 12/25/2015

CLINICAL DATA: Followup colon adenocarcinoma. New right lower
quadrant pain and palpable abdominal mass. Elevated CEA level.

EXAM:
CT ABDOMEN AND PELVIS WITH CONTRAST
TECHNIQUE: Multidetector CT imaging of the abdomen and pelvis was performed
using the standard protocol following bolus administration of
intravenous contrast.
CONTRAST:  100mL MBVFBI-THH IOPAMIDOL (MBVFBI-THH) INJECTION 61%

[Series 2: axial st · axial · 0.65mm/px · z∈[+819,+1194]mm · 13 of 89 slices shown, 15 images]
[im 7/89  soft-tissue]
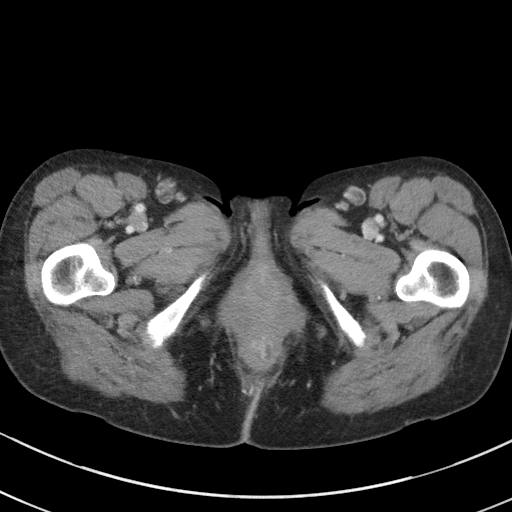
[im 7/89  bone]
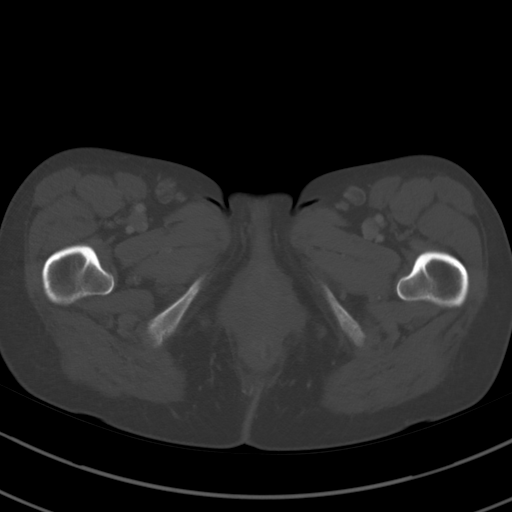
[im 13/89  soft-tissue]
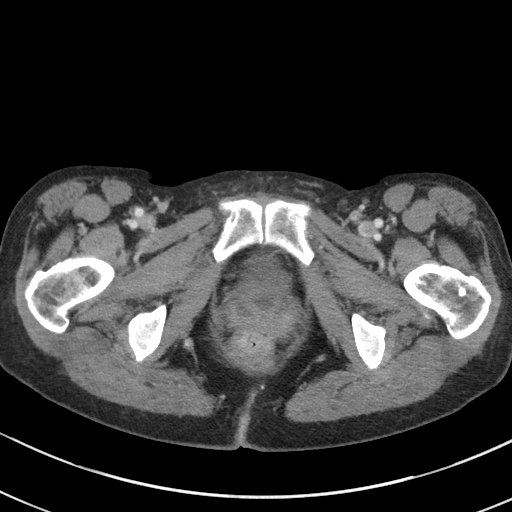
[im 19/89  soft-tissue]
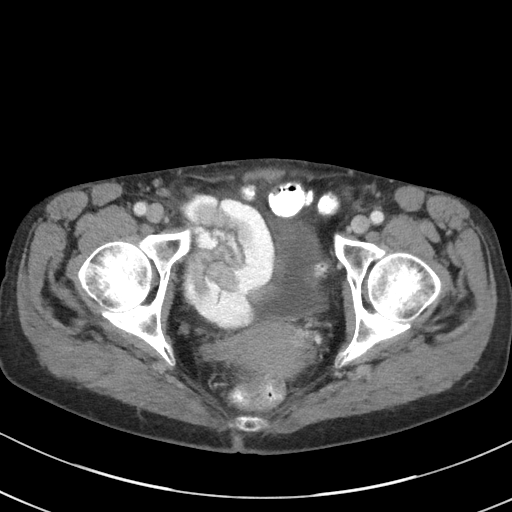
[im 26/89  soft-tissue]
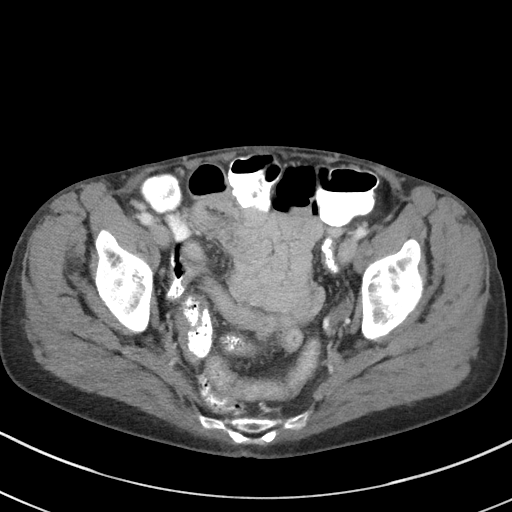
[im 32/89  soft-tissue]
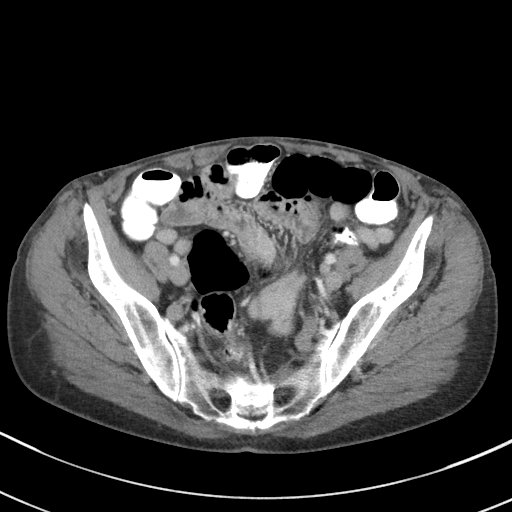
[im 38/89  soft-tissue]
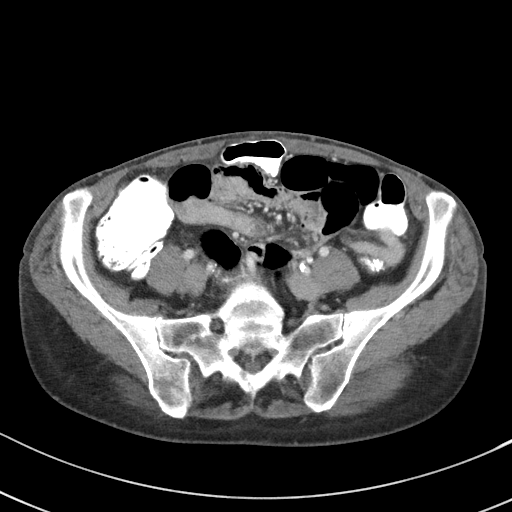
[im 45/89  soft-tissue]
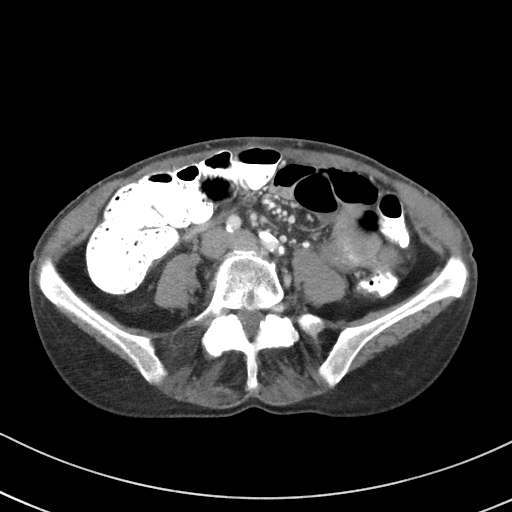
[im 51/89  soft-tissue]
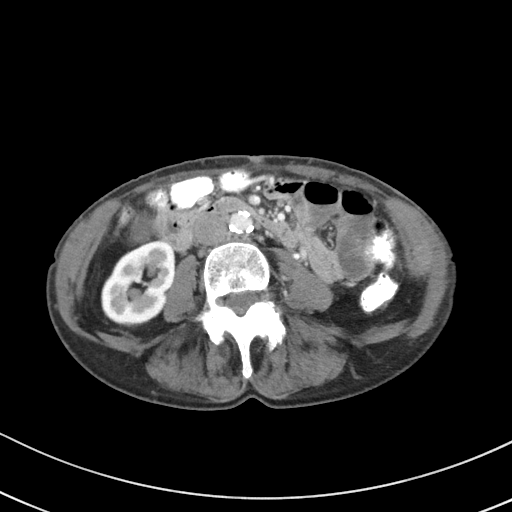
[im 57/89  soft-tissue]
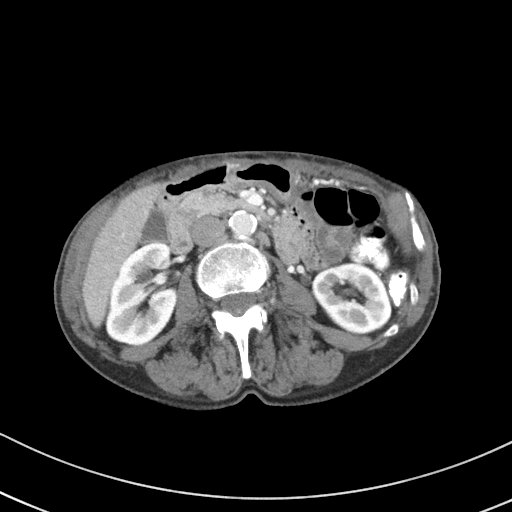
[im 57/89  bone]
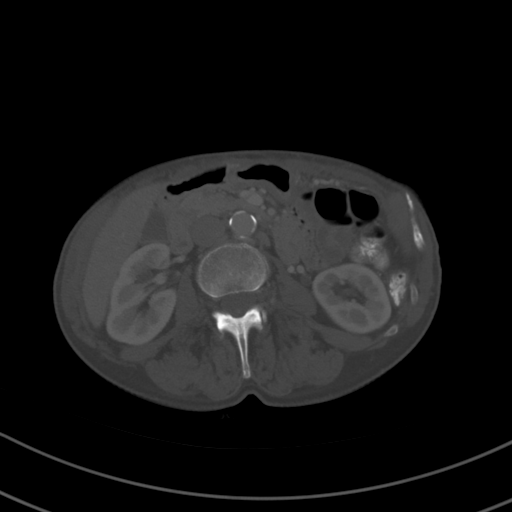
[im 63/89  soft-tissue]
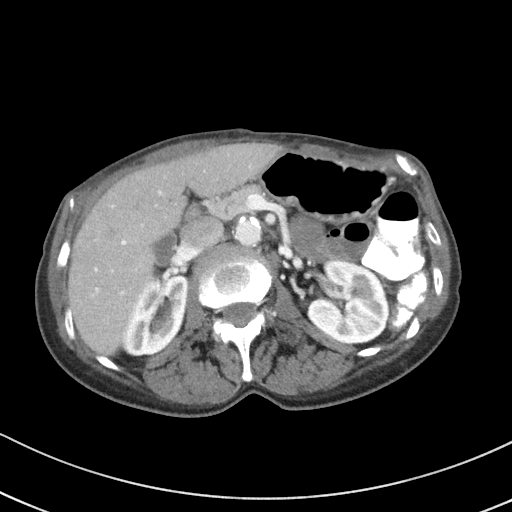
[im 70/89  soft-tissue]
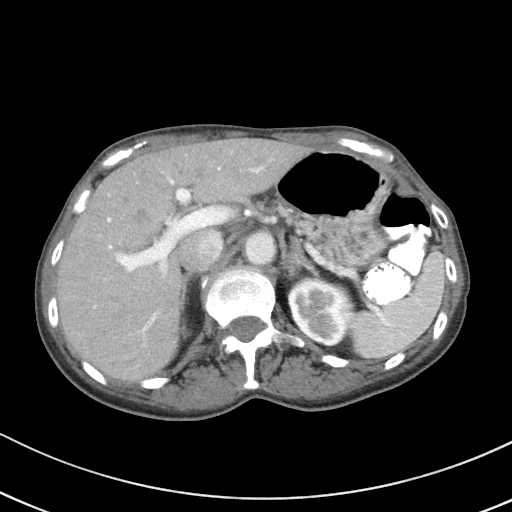
[im 76/89  soft-tissue]
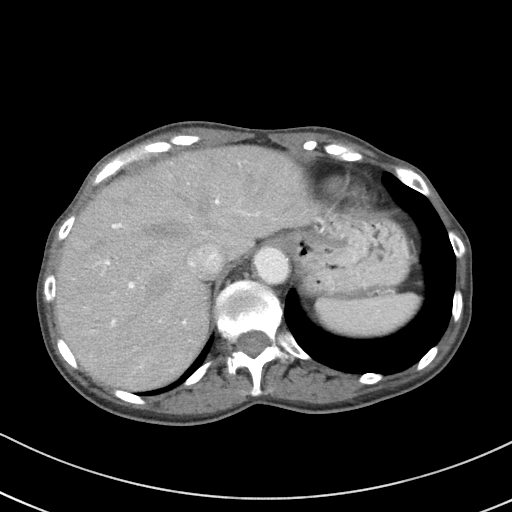
[im 82/89  soft-tissue]
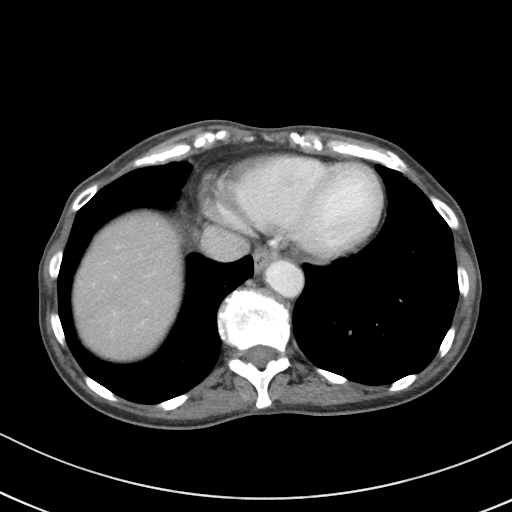

[Series 4: coronal st · coronal · 0.63mm/px · 3 of 73 slices shown]
[im 25/73  soft-tissue]
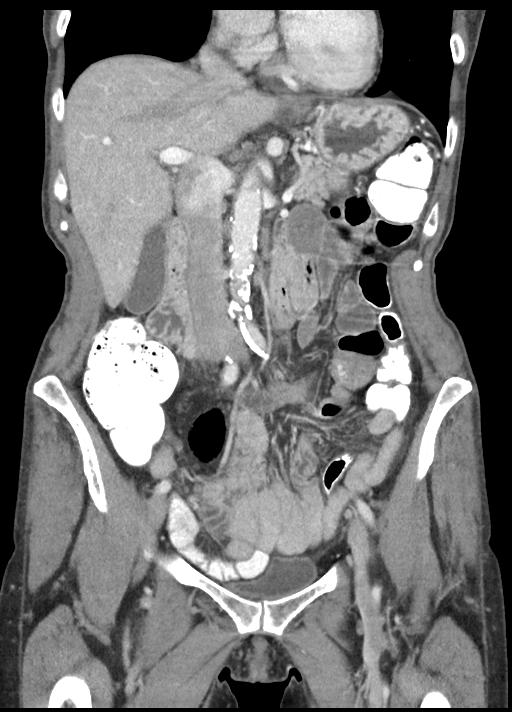
[im 33/73  soft-tissue]
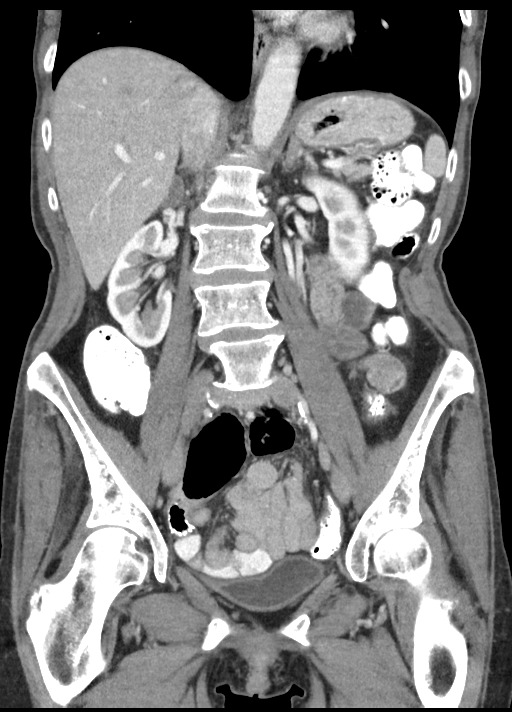
[im 41/73  soft-tissue]
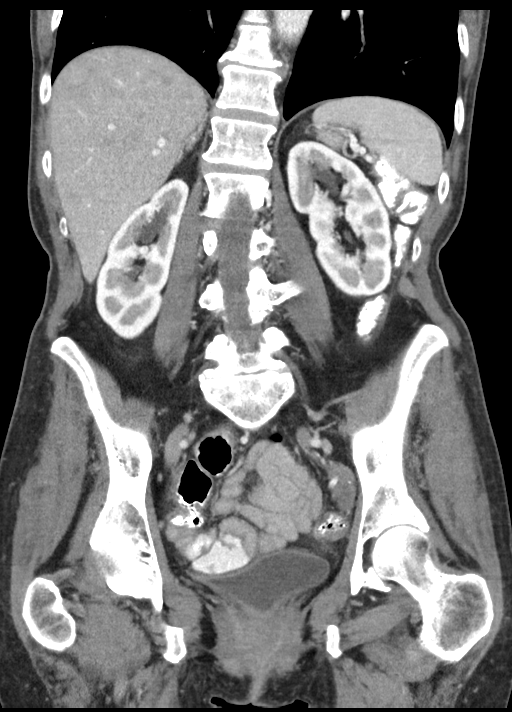

[16 of 46 positions shown; findings below may reference images not displayed]

FINDINGS: Lower Chest: No acute findings.

Hepatobiliary:  No masses identified. Gallbladder is unremarkable.

Pancreas:  No mass or inflammatory changes.

Spleen: Within normal limits in size and appearance.

Adrenals/Urinary Tract: No masses identified. No evidence of
hydronephrosis.

Stomach/Bowel: No evidence of obstruction, inflammatory process or
abnormal fluid collections.

Vascular/Lymphatic: No pathologically enlarged lymph nodes. Stable
11 mm aneurysm of the celiac axis. No abdominal aortic aneurysm.
Aortic atherosclerosis.

Reproductive:  No mass or other significant abnormality.

Other:  Stable small bilateral inguinal hernias containing only fat.

Musculoskeletal:  No suspicious bone lesions identified.
IMPRESSION: No evidence of recurrent or metastatic carcinoma. No other mass or
lymphadenopathy identified.

No acute findings within the abdomen or pelvis.

Stable incidental findings including small fat containing bilateral
inguinal hernias, 11 mm aneurysm of celiac axis, and aortic
atherosclerosis.

## 2020-01-18 DIAGNOSIS — Z23 Encounter for immunization: Secondary | ICD-10-CM | POA: Diagnosis not present

## 2020-01-18 DIAGNOSIS — F17200 Nicotine dependence, unspecified, uncomplicated: Secondary | ICD-10-CM | POA: Diagnosis not present

## 2020-01-18 DIAGNOSIS — Z6820 Body mass index (BMI) 20.0-20.9, adult: Secondary | ICD-10-CM | POA: Diagnosis not present

## 2020-01-18 DIAGNOSIS — J439 Emphysema, unspecified: Secondary | ICD-10-CM | POA: Diagnosis not present

## 2020-01-18 DIAGNOSIS — C189 Malignant neoplasm of colon, unspecified: Secondary | ICD-10-CM | POA: Diagnosis not present

## 2020-01-18 DIAGNOSIS — Z0001 Encounter for general adult medical examination with abnormal findings: Secondary | ICD-10-CM | POA: Diagnosis not present

## 2020-01-18 DIAGNOSIS — E782 Mixed hyperlipidemia: Secondary | ICD-10-CM | POA: Diagnosis not present

## 2020-01-18 DIAGNOSIS — Z1389 Encounter for screening for other disorder: Secondary | ICD-10-CM | POA: Diagnosis not present

## 2020-01-18 DIAGNOSIS — J449 Chronic obstructive pulmonary disease, unspecified: Secondary | ICD-10-CM | POA: Diagnosis not present

## 2020-01-18 DIAGNOSIS — E441 Mild protein-calorie malnutrition: Secondary | ICD-10-CM | POA: Diagnosis not present

## 2020-01-23 ENCOUNTER — Other Ambulatory Visit (HOSPITAL_COMMUNITY): Payer: Self-pay | Admitting: Family Medicine

## 2020-01-23 DIAGNOSIS — Z1231 Encounter for screening mammogram for malignant neoplasm of breast: Secondary | ICD-10-CM

## 2020-01-23 DIAGNOSIS — E2839 Other primary ovarian failure: Secondary | ICD-10-CM

## 2020-05-03 DIAGNOSIS — E871 Hypo-osmolality and hyponatremia: Secondary | ICD-10-CM | POA: Diagnosis not present

## 2021-02-18 DIAGNOSIS — D638 Anemia in other chronic diseases classified elsewhere: Secondary | ICD-10-CM | POA: Diagnosis not present

## 2021-02-18 DIAGNOSIS — Z13220 Encounter for screening for lipoid disorders: Secondary | ICD-10-CM | POA: Diagnosis not present

## 2021-02-18 DIAGNOSIS — Z6820 Body mass index (BMI) 20.0-20.9, adult: Secondary | ICD-10-CM | POA: Diagnosis not present

## 2021-02-18 DIAGNOSIS — E441 Mild protein-calorie malnutrition: Secondary | ICD-10-CM | POA: Diagnosis not present

## 2021-02-18 DIAGNOSIS — R7309 Other abnormal glucose: Secondary | ICD-10-CM | POA: Diagnosis not present

## 2021-02-18 DIAGNOSIS — J439 Emphysema, unspecified: Secondary | ICD-10-CM | POA: Diagnosis not present

## 2021-02-18 DIAGNOSIS — J449 Chronic obstructive pulmonary disease, unspecified: Secondary | ICD-10-CM | POA: Diagnosis not present

## 2021-02-18 DIAGNOSIS — C189 Malignant neoplasm of colon, unspecified: Secondary | ICD-10-CM | POA: Diagnosis not present

## 2021-02-18 DIAGNOSIS — F70 Mild intellectual disabilities: Secondary | ICD-10-CM | POA: Diagnosis not present

## 2021-02-27 DIAGNOSIS — D649 Anemia, unspecified: Secondary | ICD-10-CM | POA: Diagnosis not present

## 2021-02-27 DIAGNOSIS — E441 Mild protein-calorie malnutrition: Secondary | ICD-10-CM | POA: Diagnosis not present

## 2021-02-27 DIAGNOSIS — Z681 Body mass index (BMI) 19 or less, adult: Secondary | ICD-10-CM | POA: Diagnosis not present

## 2021-02-27 DIAGNOSIS — E559 Vitamin D deficiency, unspecified: Secondary | ICD-10-CM | POA: Diagnosis not present

## 2021-02-27 DIAGNOSIS — J439 Emphysema, unspecified: Secondary | ICD-10-CM | POA: Diagnosis not present

## 2021-02-27 DIAGNOSIS — C189 Malignant neoplasm of colon, unspecified: Secondary | ICD-10-CM | POA: Diagnosis not present

## 2021-02-27 DIAGNOSIS — F70 Mild intellectual disabilities: Secondary | ICD-10-CM | POA: Diagnosis not present

## 2021-02-27 DIAGNOSIS — E876 Hypokalemia: Secondary | ICD-10-CM | POA: Diagnosis not present

## 2021-03-04 DIAGNOSIS — Z1211 Encounter for screening for malignant neoplasm of colon: Secondary | ICD-10-CM | POA: Diagnosis not present

## 2022-01-06 ENCOUNTER — Encounter (INDEPENDENT_AMBULATORY_CARE_PROVIDER_SITE_OTHER): Payer: Self-pay | Admitting: *Deleted

## 2023-04-08 DIAGNOSIS — E559 Vitamin D deficiency, unspecified: Secondary | ICD-10-CM | POA: Diagnosis not present

## 2023-04-08 DIAGNOSIS — D649 Anemia, unspecified: Secondary | ICD-10-CM | POA: Diagnosis not present

## 2023-04-08 DIAGNOSIS — Z6820 Body mass index (BMI) 20.0-20.9, adult: Secondary | ICD-10-CM | POA: Diagnosis not present

## 2023-04-08 DIAGNOSIS — E782 Mixed hyperlipidemia: Secondary | ICD-10-CM | POA: Diagnosis not present

## 2023-04-08 DIAGNOSIS — E441 Mild protein-calorie malnutrition: Secondary | ICD-10-CM | POA: Diagnosis not present

## 2023-04-08 DIAGNOSIS — Z0001 Encounter for general adult medical examination with abnormal findings: Secondary | ICD-10-CM | POA: Diagnosis not present

## 2023-04-08 DIAGNOSIS — C189 Malignant neoplasm of colon, unspecified: Secondary | ICD-10-CM | POA: Diagnosis not present

## 2023-04-08 DIAGNOSIS — D638 Anemia in other chronic diseases classified elsewhere: Secondary | ICD-10-CM | POA: Diagnosis not present

## 2023-04-08 DIAGNOSIS — J449 Chronic obstructive pulmonary disease, unspecified: Secondary | ICD-10-CM | POA: Diagnosis not present

## 2023-11-11 DIAGNOSIS — J449 Chronic obstructive pulmonary disease, unspecified: Secondary | ICD-10-CM | POA: Diagnosis not present

## 2023-11-11 DIAGNOSIS — Z6821 Body mass index (BMI) 21.0-21.9, adult: Secondary | ICD-10-CM | POA: Diagnosis not present
